# Patient Record
Sex: Male | Born: 1935 | Race: White | Hispanic: No | Marital: Married | Smoking: Never smoker
Health system: Southern US, Community
[De-identification: ages and names within clinical notes are randomized; demographics above are authoritative.]

## PROBLEM LIST (undated history)

## (undated) DIAGNOSIS — F329 Major depressive disorder, single episode, unspecified: Secondary | ICD-10-CM

## (undated) DIAGNOSIS — J45909 Unspecified asthma, uncomplicated: Secondary | ICD-10-CM

## (undated) DIAGNOSIS — R569 Unspecified convulsions: Secondary | ICD-10-CM

## (undated) DIAGNOSIS — F0390 Unspecified dementia without behavioral disturbance: Secondary | ICD-10-CM

## (undated) DIAGNOSIS — F32A Depression, unspecified: Secondary | ICD-10-CM

## (undated) DIAGNOSIS — N183 Chronic kidney disease, stage 3 unspecified: Secondary | ICD-10-CM

## (undated) DIAGNOSIS — I10 Essential (primary) hypertension: Secondary | ICD-10-CM

## (undated) DIAGNOSIS — F419 Anxiety disorder, unspecified: Secondary | ICD-10-CM

## (undated) DIAGNOSIS — E7800 Pure hypercholesterolemia, unspecified: Secondary | ICD-10-CM

## (undated) HISTORY — PX: PROSTATE SURGERY: SHX751

---

## 2005-08-16 ENCOUNTER — Ambulatory Visit: Payer: Self-pay | Admitting: Internal Medicine

## 2005-11-23 ENCOUNTER — Ambulatory Visit: Payer: Self-pay | Admitting: Internal Medicine

## 2005-12-29 ENCOUNTER — Ambulatory Visit: Payer: Self-pay | Admitting: Unknown Physician Specialty

## 2008-06-16 ENCOUNTER — Ambulatory Visit: Payer: Self-pay | Admitting: Internal Medicine

## 2010-01-21 ENCOUNTER — Ambulatory Visit: Payer: Self-pay | Admitting: Internal Medicine

## 2010-02-18 ENCOUNTER — Inpatient Hospital Stay: Payer: Self-pay | Admitting: Internal Medicine

## 2010-02-22 ENCOUNTER — Ambulatory Visit: Payer: Self-pay | Admitting: Unknown Physician Specialty

## 2010-05-17 ENCOUNTER — Ambulatory Visit: Payer: Self-pay | Admitting: Oncology

## 2010-05-27 ENCOUNTER — Ambulatory Visit: Payer: Self-pay | Admitting: Oncology

## 2010-06-17 ENCOUNTER — Ambulatory Visit: Payer: Self-pay | Admitting: Oncology

## 2010-07-17 ENCOUNTER — Ambulatory Visit: Payer: Self-pay | Admitting: Oncology

## 2010-07-29 ENCOUNTER — Ambulatory Visit: Payer: Self-pay | Admitting: Oncology

## 2010-08-17 ENCOUNTER — Ambulatory Visit: Payer: Self-pay | Admitting: Oncology

## 2010-10-28 ENCOUNTER — Ambulatory Visit: Payer: Self-pay | Admitting: Oncology

## 2010-11-17 ENCOUNTER — Ambulatory Visit: Payer: Self-pay | Admitting: Oncology

## 2010-12-13 ENCOUNTER — Ambulatory Visit: Payer: Self-pay | Admitting: Unknown Physician Specialty

## 2011-02-17 ENCOUNTER — Ambulatory Visit: Payer: Self-pay | Admitting: Oncology

## 2011-03-18 ENCOUNTER — Ambulatory Visit: Payer: Self-pay | Admitting: Oncology

## 2015-01-05 ENCOUNTER — Ambulatory Visit: Payer: Self-pay | Admitting: Physician Assistant

## 2015-07-12 ENCOUNTER — Inpatient Hospital Stay
Admission: EM | Admit: 2015-07-12 | Discharge: 2015-07-16 | DRG: 377 | Disposition: A | Discharge status: Skilled Nursing Facility | Payer: Medicare PPO | Attending: Internal Medicine | Admitting: Internal Medicine

## 2015-07-12 ENCOUNTER — Emergency Department: Payer: Medicare PPO

## 2015-07-12 ENCOUNTER — Encounter: Payer: Self-pay | Admitting: *Deleted

## 2015-07-12 DIAGNOSIS — K228 Other specified diseases of esophagus: Secondary | ICD-10-CM | POA: Diagnosis not present

## 2015-07-12 DIAGNOSIS — J45909 Unspecified asthma, uncomplicated: Secondary | ICD-10-CM | POA: Diagnosis present

## 2015-07-12 DIAGNOSIS — Z91041 Radiographic dye allergy status: Secondary | ICD-10-CM | POA: Diagnosis not present

## 2015-07-12 DIAGNOSIS — N179 Acute kidney failure, unspecified: Secondary | ICD-10-CM | POA: Diagnosis present

## 2015-07-12 DIAGNOSIS — F42 Obsessive-compulsive disorder: Secondary | ICD-10-CM | POA: Diagnosis present

## 2015-07-12 DIAGNOSIS — K922 Gastrointestinal hemorrhage, unspecified: Secondary | ICD-10-CM | POA: Diagnosis present

## 2015-07-12 DIAGNOSIS — Z79899 Other long term (current) drug therapy: Secondary | ICD-10-CM | POA: Diagnosis not present

## 2015-07-12 DIAGNOSIS — F32A Depression, unspecified: Secondary | ICD-10-CM

## 2015-07-12 DIAGNOSIS — D649 Anemia, unspecified: Secondary | ICD-10-CM

## 2015-07-12 DIAGNOSIS — E780 Pure hypercholesterolemia: Secondary | ICD-10-CM | POA: Diagnosis present

## 2015-07-12 DIAGNOSIS — K5641 Fecal impaction: Secondary | ICD-10-CM | POA: Diagnosis not present

## 2015-07-12 DIAGNOSIS — K449 Diaphragmatic hernia without obstruction or gangrene: Secondary | ICD-10-CM | POA: Diagnosis present

## 2015-07-12 DIAGNOSIS — F419 Anxiety disorder, unspecified: Secondary | ICD-10-CM | POA: Diagnosis present

## 2015-07-12 DIAGNOSIS — Z801 Family history of malignant neoplasm of trachea, bronchus and lung: Secondary | ICD-10-CM | POA: Diagnosis not present

## 2015-07-12 DIAGNOSIS — D509 Iron deficiency anemia, unspecified: Secondary | ICD-10-CM | POA: Diagnosis not present

## 2015-07-12 DIAGNOSIS — N183 Chronic kidney disease, stage 3 (moderate): Secondary | ICD-10-CM | POA: Diagnosis not present

## 2015-07-12 DIAGNOSIS — F03918 Unspecified dementia, unspecified severity, with other behavioral disturbance: Secondary | ICD-10-CM

## 2015-07-12 DIAGNOSIS — I951 Orthostatic hypotension: Secondary | ICD-10-CM | POA: Diagnosis not present

## 2015-07-12 DIAGNOSIS — E43 Unspecified severe protein-calorie malnutrition: Secondary | ICD-10-CM | POA: Diagnosis present

## 2015-07-12 DIAGNOSIS — Z66 Do not resuscitate: Secondary | ICD-10-CM | POA: Diagnosis not present

## 2015-07-12 DIAGNOSIS — R634 Abnormal weight loss: Secondary | ICD-10-CM

## 2015-07-12 DIAGNOSIS — R296 Repeated falls: Secondary | ICD-10-CM | POA: Diagnosis present

## 2015-07-12 DIAGNOSIS — E860 Dehydration: Secondary | ICD-10-CM | POA: Diagnosis not present

## 2015-07-12 DIAGNOSIS — F329 Major depressive disorder, single episode, unspecified: Secondary | ICD-10-CM | POA: Diagnosis present

## 2015-07-12 DIAGNOSIS — K649 Unspecified hemorrhoids: Secondary | ICD-10-CM | POA: Diagnosis present

## 2015-07-12 DIAGNOSIS — I129 Hypertensive chronic kidney disease with stage 1 through stage 4 chronic kidney disease, or unspecified chronic kidney disease: Secondary | ICD-10-CM | POA: Diagnosis present

## 2015-07-12 DIAGNOSIS — R4182 Altered mental status, unspecified: Secondary | ICD-10-CM

## 2015-07-12 DIAGNOSIS — F0391 Unspecified dementia with behavioral disturbance: Secondary | ICD-10-CM | POA: Diagnosis present

## 2015-07-12 HISTORY — DX: Depression, unspecified: F32.A

## 2015-07-12 HISTORY — DX: Unspecified asthma, uncomplicated: J45.909

## 2015-07-12 HISTORY — DX: Unspecified dementia, unspecified severity, without behavioral disturbance, psychotic disturbance, mood disturbance, and anxiety: F03.90

## 2015-07-12 HISTORY — DX: Chronic kidney disease, stage 3 (moderate): N18.3

## 2015-07-12 HISTORY — DX: Essential (primary) hypertension: I10

## 2015-07-12 HISTORY — DX: Chronic kidney disease, stage 3 unspecified: N18.30

## 2015-07-12 HISTORY — DX: Anxiety disorder, unspecified: F41.9

## 2015-07-12 HISTORY — DX: Pure hypercholesterolemia, unspecified: E78.00

## 2015-07-12 HISTORY — DX: Major depressive disorder, single episode, unspecified: F32.9

## 2015-07-12 LAB — CBC
HCT: 23.2 % — ABNORMAL LOW (ref 40.0–52.0)
Hemoglobin: 7.4 g/dL — ABNORMAL LOW (ref 13.0–18.0)
MCH: 26 pg (ref 26.0–34.0)
MCHC: 31.8 g/dL — ABNORMAL LOW (ref 32.0–36.0)
MCV: 81.8 fL (ref 80.0–100.0)
PLATELETS: 383 10*3/uL (ref 150–440)
RBC: 2.84 MIL/uL — AB (ref 4.40–5.90)
RDW: 17 % — AB (ref 11.5–14.5)
WBC: 6.5 10*3/uL (ref 3.8–10.6)

## 2015-07-12 LAB — IRON AND TIBC
Iron: 14 ug/dL — ABNORMAL LOW (ref 45–182)
SATURATION RATIOS: 4 % — AB (ref 17.9–39.5)
TIBC: 376 ug/dL (ref 250–450)
UIBC: 362 ug/dL

## 2015-07-12 LAB — AMMONIA: Ammonia: 16 umol/L (ref 9–35)

## 2015-07-12 LAB — RETICULOCYTES
RBC.: 2.9 MIL/uL — AB (ref 4.40–5.90)
RETIC CT PCT: 1.3 % (ref 0.4–3.1)
Retic Count, Absolute: 37.7 10*3/uL (ref 19.0–183.0)

## 2015-07-12 LAB — URINALYSIS COMPLETE WITH MICROSCOPIC (ARMC ONLY)
BILIRUBIN URINE: NEGATIVE
Bacteria, UA: NONE SEEN
Glucose, UA: NEGATIVE mg/dL
HGB URINE DIPSTICK: NEGATIVE
LEUKOCYTES UA: NEGATIVE
NITRITE: NEGATIVE
PH: 6 (ref 5.0–8.0)
PROTEIN: NEGATIVE mg/dL
RBC / HPF: NONE SEEN RBC/hpf (ref 0–5)
SPECIFIC GRAVITY, URINE: 1.019 (ref 1.005–1.030)
Squamous Epithelial / LPF: NONE SEEN

## 2015-07-12 LAB — COMPREHENSIVE METABOLIC PANEL
ALT: 16 U/L — AB (ref 17–63)
AST: 22 U/L (ref 15–41)
Albumin: 3.7 g/dL (ref 3.5–5.0)
Alkaline Phosphatase: 48 U/L (ref 38–126)
Anion gap: 6 (ref 5–15)
BUN: 24 mg/dL — ABNORMAL HIGH (ref 6–20)
CHLORIDE: 105 mmol/L (ref 101–111)
CO2: 27 mmol/L (ref 22–32)
CREATININE: 1.66 mg/dL — AB (ref 0.61–1.24)
Calcium: 9.7 mg/dL (ref 8.9–10.3)
GFR calc non Af Amer: 37 mL/min — ABNORMAL LOW (ref >60)
GFR, EST AFRICAN AMERICAN: 43 mL/min — AB (ref >60)
Glucose, Bld: 76 mg/dL (ref 65–99)
POTASSIUM: 3.8 mmol/L (ref 3.5–5.1)
SODIUM: 138 mmol/L (ref 135–145)
Total Bilirubin: 0.7 mg/dL (ref 0.3–1.2)
Total Protein: 6.6 g/dL (ref 6.5–8.1)

## 2015-07-12 LAB — VITAMIN B12: Vitamin B-12: 774 pg/mL (ref 180–914)

## 2015-07-12 LAB — FERRITIN: FERRITIN: 5 ng/mL — AB (ref 24–336)

## 2015-07-12 LAB — TROPONIN I
Troponin I: <0.03 ng/mL (ref <0.031)
Troponin I: <0.03 ng/mL (ref <0.031)

## 2015-07-12 LAB — FOLATE: Folate: 12 ng/mL (ref >5.9)

## 2015-07-12 MED ORDER — ACETAMINOPHEN 650 MG RE SUPP: 650.0000 mg | Freq: Four times a day (QID) | RECTAL | Status: DC | PRN

## 2015-07-12 MED ORDER — SODIUM CHLORIDE 0.9 % IV SOLN
INTRAVENOUS | Status: DC
  Administered 2015-07-12 – 2015-07-13 (×2): via INTRAVENOUS

## 2015-07-12 MED ORDER — SODIUM CHLORIDE 0.9 % IV BOLUS (SEPSIS)
500.0000 mL | Freq: Once | INTRAVENOUS | Status: AC
  Administered 2015-07-12: 500 mL via INTRAVENOUS

## 2015-07-12 MED ORDER — ALBUTEROL SULFATE (2.5 MG/3ML) 0.083% IN NEBU: 2.5000 mg | INHALATION_SOLUTION | RESPIRATORY_TRACT | Status: DC | PRN

## 2015-07-12 MED ORDER — ONDANSETRON HCL 4 MG PO TABS: 4.0000 mg | ORAL_TABLET | Freq: Four times a day (QID) | ORAL | Status: DC | PRN

## 2015-07-12 MED ORDER — ONDANSETRON HCL 4 MG/2ML IJ SOLN: 4.0000 mg | Freq: Four times a day (QID) | INTRAMUSCULAR | Status: DC | PRN

## 2015-07-12 MED ORDER — MIRTAZAPINE 15 MG PO TABS
15.0000 mg | ORAL_TABLET | Freq: Every day | ORAL | Status: DC
  Administered 2015-07-12 – 2015-07-15 (×4): 15 mg via ORAL
  Filled 2015-07-12 (×4): qty 1

## 2015-07-12 MED ORDER — ACETAMINOPHEN 325 MG PO TABS: 650.0000 mg | ORAL_TABLET | Freq: Four times a day (QID) | ORAL | Status: DC | PRN

## 2015-07-12 MED ORDER — SENNOSIDES-DOCUSATE SODIUM 8.6-50 MG PO TABS: 1.0000 | ORAL_TABLET | Freq: Every evening | ORAL | Status: DC | PRN

## 2015-07-12 MED ORDER — BUPROPION HCL ER (XL) 150 MG PO TB24
450.0000 mg | ORAL_TABLET | Freq: Every day | ORAL | Status: DC
  Administered 2015-07-12 – 2015-07-16 (×5): 450 mg via ORAL
  Filled 2015-07-12 (×5): qty 3

## 2015-07-12 MED ORDER — VENLAFAXINE HCL ER 75 MG PO CP24
150.0000 mg | ORAL_CAPSULE | Freq: Every morning | ORAL | Status: DC
  Administered 2015-07-12 – 2015-07-16 (×5): 150 mg via ORAL
  Filled 2015-07-12 (×5): qty 2

## 2015-07-12 MED ORDER — SODIUM CHLORIDE 0.9 % IJ SOLN
3.0000 mL | Freq: Two times a day (BID) | INTRAMUSCULAR | Status: DC
  Administered 2015-07-14 – 2015-07-16 (×5): 3 mL via INTRAVENOUS

## 2015-07-12 NOTE — H&P (Signed)
[facility] - [place] [facility]   PATIENT NAME: Benjamin Kim    MR#:  [id number]  DATE OF BIRTH:  August 21, 1932  DATE OF ADMISSION:  07/12/2015  PRIMARY CARE PHYSICIAN: Lauro Regulus., MD   REQUESTING/REFERRING PHYSICIAN: Dr. Scotty Court  CHIEF COMPLAINT:   Chief Complaint  Patient presents with  . Weakness    Per EMS pt has fallen x 3 since yesterday. States family reported that pt has had increased weakness and slurred speech.     HISTORY OF PRESENT ILLNESS:  Benjamin Kim  is a 81 y.o. male with a known history of dementia, depression, hypertension, CKD-3, hemorrhoids presents after fall. History is provided by his wife as the patient is a poor historian. She states that he has had multiple falls since March and has had increasing confusion over the past few weeks. She states that last night she went out to walk the dog and when she came home the patient had fallen and was laying on the floor in the hallway. He was combative and confused and it was difficult to get him up to the bed. At 1:30 AM on the day of admission he got up to use the bathroom she attempted to assist him but he fell again. He was again combative and confused and would not accept her assistance. She called 911. On emergency room evaluation he has no significant traumatic injury from the fall but has been found to be anemic with a hemoglobin of 7.4. The wife denies any recent abdominal pain nausea or vomiting. She states that he has  hemorrhoids and often sees blood with bowel movements. He is frequently constipated and straining. He has had a colonoscopy in the past few years his primary gastroenterologist is Dr. Mechele Collin. His appetite has been severely decreased over the past few months due to anxiety.  PAST MEDICAL HISTORY:   Past Medical History  Diagnosis Date  . Hypertension   . Hypercholesteremia   . Depressed   . CKD (chronic kidney disease) stage 3, GFR 30-59 ml/min   .  Asthma   . Dementia   . Anxiety     PAST SURGICAL HISTORY:   Past Surgical History  Procedure Laterality Date  . Prostate surgery      SOCIAL HISTORY:   Social History  Substance Use Topics  . Smoking status: Never Smoker   . Smokeless tobacco: Not on file  . Alcohol Use: No    FAMILY HISTORY:   Family History  Problem Relation Age of Onset  . Lung cancer Father     DRUG ALLERGIES:   Allergies  Allergen Reactions  . Ivp Dye [Iodinated Diagnostic Agents] Anaphylaxis    REVIEW OF SYSTEMS:   ROS  Unable to obtain due to patient's altered mental status, dementia and confusion  MEDICATIONS AT HOME:   Prior to Admission medications   Medication Sig Start Date End Date Taking? Authorizing Provider  buPROPion (WELLBUTRIN XL) 150 MG 24 hr tablet Take 3 tablets by mouth daily. 07/07/15  Yes Historical Provider, MD  LORazepam (ATIVAN) 0.5 MG tablet Take 0.5 mg by mouth 2 (two) times daily. Takes at 1pm and bedtime 07/07/15  Yes Historical Provider, MD  mirtazapine (REMERON) 15 MG tablet Take 1 tablet by mouth at bedtime. 06/12/15  Yes Historical Provider, MD  temazepam (RESTORIL) 7.5 MG capsule Take 7.5 mg by mouth at bedtime. 07/07/15  Yes Historical Provider, MD  venlafaxine XR (EFFEXOR-XR) 75 MG 24 hr capsule Take 150 mg by  mouth every morning. 06/16/15  Yes Historical Provider, MD      VITAL SIGNS:  Blood pressure 130/69, pulse 74, temperature 98.2 F (36.8 C), temperature source Oral, resp. rate 14, weight 59.506 kg (131 lb 3 oz), SpO2 97 %.  PHYSICAL EXAMINATION:  GENERAL:  81 y.o.-year-old patient lying in the bed with no acute distress. Pale, thin EYES: Pupils equal, round, reactive to light and accommodation. No scleral icterus. Extraocular muscles intact.  HEENT: Head atraumatic, normocephalic. Oropharynx and nasopharynx clear. Mucous membranes are dry NECK:  Supple, no jugular venous distention. No thyroid enlargement, no tenderness.  LUNGS: Normal breath  sounds bilaterally, no wheezing, rales,rhonchi or crepitation. No use of accessory muscles of respiration.  CARDIOVASCULAR: S1, S2 normal. No murmurs, rubs, or gallops.  ABDOMEN: Soft, nontender, nondistended. Bowel sounds present. No organomegaly or mass. No guarding no rebound EXTREMITIES: No pedal edema, cyanosis, or clubbing.  NEUROLOGIC: Patient very sleepy, but refuses to open his eyes or participate in examination, cranial nerves grossly intact and he does move all 4 extremities spontaneously  PSYCHIATRIC: The patient is sleepy, oriented to person and place SKIN: No obvious rash, lesion, or ulcer.   LABORATORY PANEL:   CBC  Recent Labs Lab 07/12/15 0328  WBC 6.5  HGB 7.4*  HCT 23.2*  PLT 383   ------------------------------------------------------------------------------------------------------------------  Chemistries   Recent Labs Lab 07/12/15 0328  NA 138  K 3.8  CL 105  CO2 27  GLUCOSE 76  BUN 24*  CREATININE 1.66*  CALCIUM 9.7  AST 22  ALT 16*  ALKPHOS 48  BILITOT 0.7   ------------------------------------------------------------------------------------------------------------------  Cardiac Enzymes  Recent Labs Lab 07/12/15 0851  TROPONINI <0.03   ------------------------------------------------------------------------------------------------------------------  RADIOLOGY:  Ct Head Wo Contrast  07/12/2015   CLINICAL DATA:  Multiple falls since yesterday. Increased weakness and slurred speech. Altered mental status.  EXAM: CT HEAD WITHOUT CONTRAST  CT CERVICAL SPINE WITHOUT CONTRAST  TECHNIQUE: Multidetector CT imaging of the head and cervical spine was performed following the standard protocol without intravenous contrast. Multiplanar CT image reconstructions of the cervical spine were also generated.  COMPARISON:  CT head 01/05/2015  FINDINGS: CT HEAD FINDINGS  Diffuse cerebral atrophy. Ventricular dilatation consistent with central atrophy.  Low-attenuation changes throughout the deep white matter consistent with small vessel ischemia. Old lacune in the right basal ganglia. No mass effect or midline shift. No abnormal extra-axial fluid collections. Gray-white matter junctions are distinct. Basal cisterns are not effaced. No evidence of acute intracranial hemorrhage. No depressed skull fractures. Visualized paranasal sinuses and mastoid air cells are not opacified. Subcutaneous scalp lesion over the right posterior parietal lesion appears to represent a lipoma. No change since prior study.  CT CERVICAL SPINE FINDINGS  Slight anterior subluxation of C7 on T1 is likely degenerative but ligamentous injury not excluded. Otherwise normal alignment of the cervical spine. Diffuse multilevel degenerative changes with narrowed disc spaces and associated endplate hypertrophic changes. Coalition of C5-C6 and near coalition of C4 days C5 is likely degenerative. Degenerative changes throughout the cervical facet joints.  IMPRESSION: No acute intracranial abnormalities. Chronic atrophy and small vessel ischemic changes.  Diffuse degenerative change throughout the cervical spine. Slight anterior subluxation of C7 on T1 is probably degenerative but ligamentous injury not entirely excluded. No acute displaced fractures identified.   Electronically Signed   By: Burman Nieves M.D.   On: 07/12/2015 04:27   Ct Cervical Spine Wo Contrast  07/12/2015   CLINICAL DATA:  Multiple falls since yesterday. Increased weakness and  slurred speech. Altered mental status.  EXAM: CT HEAD WITHOUT CONTRAST  CT CERVICAL SPINE WITHOUT CONTRAST  TECHNIQUE: Multidetector CT imaging of the head and cervical spine was performed following the standard protocol without intravenous contrast. Multiplanar CT image reconstructions of the cervical spine were also generated.  COMPARISON:  CT head 01/05/2015  FINDINGS: CT HEAD FINDINGS  Diffuse cerebral atrophy. Ventricular dilatation consistent with  central atrophy. Low-attenuation changes throughout the deep white matter consistent with small vessel ischemia. Old lacune in the right basal ganglia. No mass effect or midline shift. No abnormal extra-axial fluid collections. Gray-white matter junctions are distinct. Basal cisterns are not effaced. No evidence of acute intracranial hemorrhage. No depressed skull fractures. Visualized paranasal sinuses and mastoid air cells are not opacified. Subcutaneous scalp lesion over the right posterior parietal lesion appears to represent a lipoma. No change since prior study.  CT CERVICAL SPINE FINDINGS  Slight anterior subluxation of C7 on T1 is likely degenerative but ligamentous injury not excluded. Otherwise normal alignment of the cervical spine. Diffuse multilevel degenerative changes with narrowed disc spaces and associated endplate hypertrophic changes. Coalition of C5-C6 and near coalition of C4 days C5 is likely degenerative. Degenerative changes throughout the cervical facet joints.  IMPRESSION: No acute intracranial abnormalities. Chronic atrophy and small vessel ischemic changes.  Diffuse degenerative change throughout the cervical spine. Slight anterior subluxation of C7 on T1 is probably degenerative but ligamentous injury not entirely excluded. No acute displaced fractures identified.   Electronically Signed   By: Burman Nieves M.D.   On: 07/12/2015 04:27   Dg Chest Portable 1 View  07/12/2015   CLINICAL DATA:  Fall, weakness, slurred speech  EXAM: PORTABLE CHEST 1 VIEW  COMPARISON:  None.  FINDINGS: Mild elevation of the left hemidiaphragm. Associated left basilar opacity, likely atelectasis, less likely pneumonia. No pleural effusion or pneumothorax.  The heart is normal in size.  IMPRESSION: Left basilar opacity, favored to reflect atelectasis, pneumonia not excluded.   Electronically Signed   By: Charline Bills M.D.   On: 07/12/2015 08:24    EKG:   Orders placed or performed during the  hospital encounter of 07/12/15  . ED EKG  . ED EKG  . EKG 12-Lead  . EKG 12-Lead    IMPRESSION AND PLAN:   #1 anemia: Hemoglobin 7.4, stool is heme positive. Last hemoglobin March 2016 is 12.5 . I have ordered an anemia panel. We'll continue to monitor CBC every 8 hours. Blood pressure is currently stable. Will consult gastroenterology.  #2 confusion, dementia, OCD, anxiety and depression: His wife reports that over the past year he has had a decline in mental status. He has had multiple medication changes by primary care and psychiatry. He does not have a urinary tract infection. Chest x-ray with possible pneumonia, but with no fevers and leukocytosis or cough this is not very likely. Check B12 and TSH. This is very likely progressive dementia. We'll get a physical therapy consultation and also social work. Currently the wife seems overwhelmed taking care of him at home.  #3 hypertension: Blood pressure initially fairly low, will hold on antihypertensives for now. Continue IV hydration.  #4 fall: Patient fell after getting up from laying down. Very likely orthostatic with decreased by mouth intake likely volume depletion. Will provide IV fluids. Physical therapy consultation. No significant injury from the fall.  #5 acute on chronic kidney disease stage III: His baseline creatinine is 1.2, so slightly increased today. Continue with IV fluids.  CODE STATUS:  DO NOT RESUSCITATE. This was discussed with the patient's wife on admission.  TOTAL TIME TAKING CARE OF THIS PATIENT: 45 minutes.  Greater than 50% of time spent in coordination of care and counseling.  Elby Showers M.D on 07/12/2015 at 11:32 AM  Between 7am to 6pm - Pager - [phone]  After 6pm go to [email or web address] - password [email or web address]  [facility]  Office  [phone]  CC: Primary care physician; Lauro Regulus., MD

## 2015-07-12 NOTE — ED Notes (Signed)
Per EMS pt has fallen x 3 since yesterday. States family reported that pt has had increased weakness and slurred speech.

## 2015-07-12 NOTE — ED Notes (Signed)
Lab notified to add blood work on to previous blood drawn.

## 2015-07-12 NOTE — Consult Note (Signed)
GI Inpatient Consult Note Christena Deem MD  Reason for Consult: Anemia and Hemoccult-positive stool   Attending Requesting Consult: Dr. Elby Showers  Outpatient Primary Physician: Dr. Einar Crow  History of Present Illness: History is obtained from patient's wife and son who are present in the room. Benjamin Kim is a 81 y.o. male with a history of hypertension, dementia, chronic kidney disease, who was brought to the emergency room after several falls on evaluation he was found to have anemia and a Hemoccult-positive stool. He does have a personal history of hemorrhoids and will at times see blood with bowel movements apparently. He had been taking a twice a day proton pump inhibitor for several months however that was discontinued apparently. Is no abdominal pain. He does have issues with constipation. His wife it started him on some probiotics which seemed to help with the nausea. He has had a lot of decline in his mental status over the past couple of months in regards to dementia. On evaluation in the emergency room his ferritin was 5 iron was 14 and his hemoglobin was 7.4.  Patient has a history of previous evaluation for iron deficiency anemia the last time in 2011. He had an EGD and colonoscopy on 02/22/2010 with a finding of diverticulosis and internal hemorrhoids. The EGD showed a large hiatal hernia with a possible ulceration. He has not had an upper GI series.  Past Medical History:  Past Medical History  Diagnosis Date  . Hypertension   . Hypercholesteremia   . Depressed   . CKD (chronic kidney disease) stage 3, GFR 30-59 ml/min   . Asthma   . Dementia   . Anxiety     Problem List: Patient Active Problem List   Diagnosis Date Noted  . Anemia 07/12/2015  . Gastrointestinal bleeding 07/12/2015    Past Surgical History: Past Surgical History  Procedure Laterality Date  . Prostate surgery      Allergies: Allergies  Allergen Reactions  . Ivp Dye  [Iodinated Diagnostic Agents] Anaphylaxis    Home Medications: Prescriptions prior to admission  Medication Sig Dispense Refill Last Dose  . buPROPion (WELLBUTRIN XL) 150 MG 24 hr tablet Take 3 tablets by mouth daily.  4 07/11/2015 at Unknown time  . LORazepam (ATIVAN) 0.5 MG tablet Take 0.5 mg by mouth 2 (two) times daily. Takes at 1pm and bedtime  2 07/11/2015 at Unknown time  . mirtazapine (REMERON) 15 MG tablet Take 1 tablet by mouth at bedtime.  11 07/11/2015 at Unknown time  . temazepam (RESTORIL) 7.5 MG capsule Take 7.5 mg by mouth at bedtime.  3 07/11/2015 at Unknown time  . venlafaxine XR (EFFEXOR-XR) 75 MG 24 hr capsule Take 150 mg by mouth every morning.  4 07/11/2015 at Unknown time   Home medication reconciliation was completed with the patient.   Scheduled Inpatient Medications:   . buPROPion  450 mg Oral Daily  . mirtazapine  15 mg Oral QHS  . sodium chloride  3 mL Intravenous Q12H  . venlafaxine XR  150 mg Oral q morning - 10a    Continuous Inpatient Infusions:   . sodium chloride 75 mL/hr at 07/12/15 1502    PRN Inpatient Medications:  acetaminophen **OR** acetaminophen, albuterol, ondansetron **OR** ondansetron (ZOFRAN) IV, senna-docusate  Family History: family history includes Lung cancer in his father.   GI Family History: Negative for colorectal cancer liver disease or ulcers  Social History:   reports that he has never smoked. He does not  have any smokeless tobacco history on file. He reports that he does not drink alcohol. The patient denies ETOH, tobacco, or drug use.   ROS  Review of Systems: 10 systems reviewed per admission history and physical agree with same  Physical Examination: BP 133/64 mmHg  Pulse 95  Temp(Src) 98.3 F (36.8 C) (Oral)  Resp 18  Ht  (1.676 m)  Wt 59.875 kg (132 lb)  BMI 21.32 kg/m2  SpO2 99% Gen: Elderly appearing 81 year old male no acute distress HEENT: Normocephalic atraumatic eyes are anicteric Neck: No JVD no  thyromegaly no lymphadenopathy Chest: Clear to auscultation CV: Regular rate and rhythm Abd: Soft nontender nondistended bowel sounds positive normoactive Ext: No clubbing cyanosis or edema Skin: Other:  Data: Lab Results  Component Value Date   WBC 6.5 07/12/2015   HGB 7.4* 07/12/2015   HCT 23.2* 07/12/2015   MCV 81.8 07/12/2015   PLT 383 07/12/2015    Recent Labs Lab 07/12/15 0328  HGB 7.4*   Lab Results  Component Value Date   NA 138 07/12/2015   K 3.8 07/12/2015   CL 105 07/12/2015   CO2 27 07/12/2015   BUN 24* 07/12/2015   CREATININE 1.66* 07/12/2015   Lab Results  Component Value Date   ALT 16* 07/12/2015   AST 22 07/12/2015   ALKPHOS 48 07/12/2015   BILITOT 0.7 07/12/2015   No results for input(s): APTT, INR, PTT in the last 168 hours. CBC Latest Ref Rng 07/12/2015  WBC 3.8 - 10.6 K/uL 6.5  Hemoglobin 13.0 - 18.0 g/dL 7.4(L)  Hematocrit 40.0 - 52.0 % 23.2(L)  Platelets 150 - 440 K/uL 383    STUDIES: Ct Head Wo Contrast  07/12/2015   CLINICAL DATA:  Multiple falls since yesterday. Increased weakness and slurred speech. Altered mental status.  EXAM: CT HEAD WITHOUT CONTRAST  CT CERVICAL SPINE WITHOUT CONTRAST  TECHNIQUE: Multidetector CT imaging of the head and cervical spine was performed following the standard protocol without intravenous contrast. Multiplanar CT image reconstructions of the cervical spine were also generated.  COMPARISON:  CT head 01/05/2015  FINDINGS: CT HEAD FINDINGS  Diffuse cerebral atrophy. Ventricular dilatation consistent with central atrophy. Low-attenuation changes throughout the deep white matter consistent with small vessel ischemia. Old lacune in the right basal ganglia. No mass effect or midline shift. No abnormal extra-axial fluid collections. Gray-white matter junctions are distinct. Basal cisterns are not effaced. No evidence of acute intracranial hemorrhage. No depressed skull fractures. Visualized paranasal sinuses and mastoid  air cells are not opacified. Subcutaneous scalp lesion over the right posterior parietal lesion appears to represent a lipoma. No change since prior study.  CT CERVICAL SPINE FINDINGS  Slight anterior subluxation of C7 on T1 is likely degenerative but ligamentous injury not excluded. Otherwise normal alignment of the cervical spine. Diffuse multilevel degenerative changes with narrowed disc spaces and associated endplate hypertrophic changes. Coalition of C5-C6 and near coalition of C4 days C5 is likely degenerative. Degenerative changes throughout the cervical facet joints.  IMPRESSION: No acute intracranial abnormalities. Chronic atrophy and small vessel ischemic changes.  Diffuse degenerative change throughout the cervical spine. Slight anterior subluxation of C7 on T1 is probably degenerative but ligamentous injury not entirely excluded. No acute displaced fractures identified.   Electronically Signed   By: Burman Nieves M.D.   On: 07/12/2015 04:27   Ct Cervical Spine Wo Contrast  07/12/2015   CLINICAL DATA:  Multiple falls since yesterday. Increased weakness and slurred speech. Altered mental status.  EXAM: CT HEAD WITHOUT CONTRAST  CT CERVICAL SPINE WITHOUT CONTRAST  TECHNIQUE: Multidetector CT imaging of the head and cervical spine was performed following the standard protocol without intravenous contrast. Multiplanar CT image reconstructions of the cervical spine were also generated.  COMPARISON:  CT head 01/05/2015  FINDINGS: CT HEAD FINDINGS  Diffuse cerebral atrophy. Ventricular dilatation consistent with central atrophy. Low-attenuation changes throughout the deep white matter consistent with small vessel ischemia. Old lacune in the right basal ganglia. No mass effect or midline shift. No abnormal extra-axial fluid collections. Gray-white matter junctions are distinct. Basal cisterns are not effaced. No evidence of acute intracranial hemorrhage. No depressed skull fractures. Visualized paranasal  sinuses and mastoid air cells are not opacified. Subcutaneous scalp lesion over the right posterior parietal lesion appears to represent a lipoma. No change since prior study.  CT CERVICAL SPINE FINDINGS  Slight anterior subluxation of C7 on T1 is likely degenerative but ligamentous injury not excluded. Otherwise normal alignment of the cervical spine. Diffuse multilevel degenerative changes with narrowed disc spaces and associated endplate hypertrophic changes. Coalition of C5-C6 and near coalition of C4 days C5 is likely degenerative. Degenerative changes throughout the cervical facet joints.  IMPRESSION: No acute intracranial abnormalities. Chronic atrophy and small vessel ischemic changes.  Diffuse degenerative change throughout the cervical spine. Slight anterior subluxation of C7 on T1 is probably degenerative but ligamentous injury not entirely excluded. No acute displaced fractures identified.   Electronically Signed   By: Burman Nieves M.D.   On: 07/12/2015 04:27   Dg Chest Portable 1 View  07/12/2015   CLINICAL DATA:  Fall, weakness, slurred speech  EXAM: PORTABLE CHEST 1 VIEW  COMPARISON:  None.  FINDINGS: Mild elevation of the left hemidiaphragm. Associated left basilar opacity, likely atelectasis, less likely pneumonia. No pleural effusion or pneumothorax.  The heart is normal in size.  IMPRESSION: Left basilar opacity, favored to reflect atelectasis, pneumonia not excluded.   Electronically Signed   By: Charline Bills M.D.   On: 07/12/2015 08:24   @  Assessment: 1. Iron deficiency anemia with Hemoccult-positive stool. Previous GI evaluation has indicated a large hiatal hernia this raising the possibility of Cameron erosions with slow blood loss. His proton pump inhibitor was recently stopped.  Recommendations: 1. She is family is hesitant to allow sedated procedure. As such we will arrange to have a upper GI series done to better define the hiatal hernia. If there are other  lesions acquiring further management EGD may be needed but that will have to be discussed further with family members again. I would place him back on a daily proton pump inhibitor and add Carafate 1 g 4 times a day. Will await results of upper GI series for further recommendations.  Thank you for the consult. Please call with questions or concerns.  Christena Deem, MD  07/12/2015 11:53 PM

## 2015-07-12 NOTE — ED Provider Notes (Signed)
-----------------------------------------   9:21 AM on 07/12/2015 -----------------------------------------  Vital signs remained stable. Review of records shows that the hemoglobin has dropped from 12.5-7.4 since March. Hemoccult positive per Dr. Leone Payor report, consistent with GI bleed and symptomatic anemia. Chest x-ray and urinalysis do not reveal any additional concerns at this time.  EKG interpreted by me  Date: 07/12/2015  Rate: 80  Rhythm: normal sinus rhythm  QRS Axis: normal  Intervals: normal  ST/T Wave abnormalities: normal  Conduction Disutrbances: none  Narrative Interpretation: unremarkable   Care of the patient was discussed with the hospitalist Dr. Allena Katz  for admission at 9:20 AM.  Final diagnoses:  Altered mental status, unspecified altered mental status type  Gastrointestinal hemorrhage, unspecified gastritis, unspecified gastrointestinal hemorrhage type  Symptomatic anemia      Sharman Cheek, MD 07/12/15 [phone]

## 2015-07-12 NOTE — ED Notes (Signed)
MD at bedside. 

## 2015-07-12 NOTE — Progress Notes (Signed)
Physical Therapy Evaluation Patient Details Name: Benjamin Kim MRN: [id number] DOB: 23-May-1932 Today's Date: 07/12/2015   History of Present Illness  Patient is an 81 y.o. male who was seen in ED on 25 September after sustaining multiple falls and experiencing progressive weakness over past 3 weeks.  Clinical Impression  Patient intermittently sleeping upon arrival of PT to ED with wife at bedside. Subjective history obtained from patient's wife. Patient confused and disoriented, able to state name after asking in various ways. Patient's wife admits that his cognition has worsened and that he has had some changes in medications in recent past. Patient able to follow one step commands inconsistently with verbal/tactile/visual cues to assess muscle strength/bed mobility. Due to confusion, it was deemed unsafe to assess transfers/gait at this point in time. Patient's wife detailed history of falls increasing in frequency more recently and notes that he tends to shuffle feet upon standing. Because patient lacks awareness of deficits/safety, patient will continue to benefit from progressive PT to improve function. At this point in time, patient is not safe to return home and would benefit from going to a SNF upon discharge.    Follow Up Recommendations SNF    Equipment Recommendations  Rolling walker with 5" wheels    Recommendations for Other Services OT consult     Precautions / Restrictions Precautions Precautions: Fall Restrictions Weight Bearing Restrictions: No      Mobility  Bed Mobility Overal bed mobility: Needs Assistance Bed Mobility: Rolling Rolling: Max assist         General bed mobility comments: Patient required maximal assistance with verbal/tactile cues to roll from R side to supine in bed. Demonstrates inability to follow commands.   Transfers Overall transfer level:  (Not assessed)                  Ambulation/Gait Ambulation/Gait assistance:  (Not  assessed)              Stairs            Wheelchair Mobility    Modified Rankin (Stroke Patients Only)       Balance Overall balance assessment: History of Falls                                           Pertinent Vitals/Pain Pain Assessment: No/denies pain    Home Living Family/patient expects to be discharged to:: Private residence Living Arrangements: Spouse/significant other Available Help at Discharge: Family;Available PRN/intermittently Type of Home: House Home Access: Level entry     Home Layout: One level Home Equipment: Walker - 4 wheels;Grab bars - tub/shower Additional Comments: Patient has become progressively more dependent on wife for ADLs, including toileting/bathing/dressing. Patient limited to household ambulation with assistance and rollator.    Prior Function Level of Independence: Needs assistance   Gait / Transfers Assistance Needed: Patient requires assistance from wife, who admits that patient has become more irritated with her assistance recently.  ADL's / Homemaking Assistance Needed: Patient is dependent on wife for ADLs, such as toileting/bathing/dressing and for obtaining groceries/cooking.  Comments: Patient's wife states that patient has been eating less and she has noticed worsening weakness/dependence.     Hand Dominance        Extremity/Trunk Assessment   Upper Extremity Assessment: Difficult to assess due to impaired cognition;Generalized weakness  Lower Extremity Assessment: Generalized weakness;Difficult to assess due to impaired cognition      Cervical / Trunk Assessment: Kyphotic  Communication   Communication: Other (comment) (Patient muttering, not answering questions directly)  Cognition Arousal/Alertness: Lethargic Behavior During Therapy: Restless (Unable to follow commands verbally/visually) Overall Cognitive Status: Impaired/Different from baseline Area of Impairment:  Orientation;Attention;Following commands;Safety/judgement;Awareness;Problem solving;Memory Orientation Level: Person (Required various forms of questioning to say his name) Current Attention Level: Alternating   Following Commands: Follows one step commands inconsistently Safety/Judgement: Decreased awareness of deficits;Decreased awareness of safety Awareness:  (Not aware of current situation)   General Comments: Patient's cognition appears to be different from baseline but has progressively changed over past 3 weeks according to patient's wife. Disoriented to place, time, and situation. Patient unable to follow one step commands consistently but can occasionally follow instructions with 25% follow through with visual cues.    General Comments      Exercises        Assessment/Plan    PT Assessment Patient needs continued PT services  PT Diagnosis Altered mental status;Generalized weakness;Difficulty walking   PT Problem List Decreased strength;Decreased safety awareness;Decreased cognition;Decreased mobility;Decreased balance;Decreased activity tolerance  PT Treatment Interventions DME instruction;Gait training;Functional mobility training;Therapeutic activities;Therapeutic exercise;Balance training;Patient/family education;Cognitive remediation   PT Goals (Current goals can be found in the Care Plan section) Acute Rehab PT Goals Patient Stated Goal: Unable to state PT Goal Formulation: With patient/family Time For Goal Achievement: 07/26/15 Potential to Achieve Goals: Fair    Frequency Min 2X/week   Barriers to discharge Inaccessible home environment;Decreased caregiver support      Co-evaluation               End of Session   Activity Tolerance: Patient limited by lethargy;Other (comment) (Confusion) Patient left: in bed;with call bell/phone within reach;with family/visitor present Nurse Communication: Mobility status;Other (comment) (Inability to assess mobility  d/t confusion/lack of awareness)         Time: 1030-1100 PT Time Calculation (min) (ACUTE ONLY): 30 min   Charges:   PT Evaluation $Initial PT Evaluation Tier I: 1 Procedure     PT G Codes:        Neita Carp, PT, DPT 07/12/2015, 11:29 AM

## 2015-07-12 NOTE — Clinical Social Work Note (Signed)
Clinical Social Work Assessment  Patient Details  Name: Benjamin Kim MRN: [id number] Date of Birth: [birth year]  Date of referral:  07/12/15               Reason for consult:  Facility Placement                Permission sought to share information with:  Facility Medical sales representative, Family Supports Permission granted to share information::  Yes, Verbal Permission Granted  Name::     Benjamin Kim [phone]  Agency::  SNF in [place]   Relationship::     Contact Information:     Housing/Transportation Living arrangements for the past 2 months:  Single Family Home Source of Information:  Patient, Spouse, Adult Children Patient Interpreter Needed:  None Criminal Activity/Legal Involvement Pertinent to Current Situation/Hospitalization:  No - Comment as needed Significant Relationships:  Adult Children, Spouse Lives with:  Spouse Do you feel safe going back to the place where you live?  No Need for family participation in patient care:  Yes (Comment)  Care giving concerns:  Current concerns are being able to get into [facility] for SNF placement.  CSW advised that she would reach out to facility but was not able to guarantee placement   Social Worker assessment / plan:  CSW explained what was recommended by PT, and what SNF placement was.  CSW also explained how medicare coverage worked for SNF placement.  Families current placement preference is [facility]	[place], but they are not farmiliar with any other facility.  CSW will send out bed offers and f/u once offers have been received.  Employment status:  Disabled (Comment on whether or not currently receiving Disability), Retired Health and safety inspector:  Medicare PT Recommendations:  Skilled Nursing Facility Information / Referral to community resources:  Skilled Nursing Facility  Patient/Family's Response to care:  Pt and pt's Benjamin are in agreement with SNF placement.    Patient/Family's Understanding of and Emotional  Response to Diagnosis, Current Treatment, and Prognosis:  Family verbalized their understanding for SNF placement and were in agreement.    Emotional Assessment Appearance:  Appears stated age Attitude/Demeanor/Rapport:   (polite but confused ) Affect (typically observed):  Appropriate Orientation:  Oriented to Self, Oriented to Place Alcohol / Substance use:  Never Used Psych involvement (Current and /or in the community):  No (Comment)  Discharge Needs  Concerns to be addressed:  Care Coordination Readmission within the last 30 days:  No Current discharge risk:  Physical Impairment Barriers to Discharge:   (pt is a [employer] pt and will take 24 hrs for auth for SNF placement.)   Chauncy Passy, LCSW 07/12/2015, 2:52 PM

## 2015-07-12 NOTE — Clinical Social Work Placement (Signed)
   CLINICAL SOCIAL WORK PLACEMENT  NOTE  Date:  07/12/2015  Patient Details  Name: Benjamin Kim MRN: [id number] Date of Birth: 1932/01/01  Clinical Social Work is seeking post-discharge placement for this patient at the Skilled  Nursing Facility level of care (*CSW will initial, date and re-position this form in  chart as items are completed):  Yes   Patient/family provided with [facility] Clinical Social Work Department's list of facilities offering this level of care within the geographic area requested by the patient (or if unable, by the patient's family).  Yes   Patient/family informed of their freedom to choose among providers that offer the needed level of care, that participate in Medicare, Medicaid or managed care program needed by the patient, have an available bed and are willing to accept the patient.  Yes   Patient/family informed of [facility]'s ownership interest in [facility] and [facility], as well as of the fact that they are under no obligation to receive care at these facilities.  PASRR submitted to EDS on       PASRR number received on       Existing PASRR number confirmed on       FL2 transmitted to all facilities in geographic area requested by pt/family on 07/12/15     FL2 transmitted to all facilities within larger geographic area on       Patient informed that his/her managed care company has contracts with or will negotiate with certain facilities, including the following:            Patient/family informed of bed offers received.  Patient chooses bed at       Physician recommends and patient chooses bed at      Patient to be transferred to   on  .  Patient to be transferred to facility by       Patient family notified on   of transfer.  Name of family member notified:        PHYSICIAN Please sign FL2     Additional Comment:    _______________________________________________ Chauncy Passy, LCSW 07/12/2015, 3:08 PM

## 2015-07-12 NOTE — Plan of Care (Signed)
Problem: Acute Rehab PT Goals(only PT should resolve) Goal: Pt will Roll Supine to Side Minimal verbal cues Goal: Pt Will Go Supine/Side To Sit Minimal verbal cues Goal: Pt Will Go Sit To Supine/Side Minimal verbal cues Goal: Patient Will Transfer Sit To/From Stand Minimal verbal cues

## 2015-07-12 NOTE — ED Provider Notes (Signed)
[facility] Emergency Department Provider Note  ____________________________________________  Time seen: Approximately 257 AM  I have reviewed the triage vital signs and the nursing notes.   HISTORY  Chief Complaint Weakness    HPI Benjamin Kim is a 81 y.o. male who is brought in by ambulance today for a fall. According to EMS and the patient's wife he fell this morning at 1:30 AM. The patient's wife reports that she feels is going to the bathroom. The patient has fallen multiple times in the last 2 days including 3 times yesterday. The patient's wife reports that he has been acting strange and seems more confused than normal. She also reports that for the past few days he's been having some slurred speech. The patient does not have dementia per his wife nor does he have any history with the similar symptoms. The patient's wife reports that he does not eat very well but she was concerned so she decided to send him in for evaluation. The patient is not complaining of any thing at this time but does seem confused and is unsure where he is or what going on. The patient has no vomiting no abdominal pain no chest pain or shortness of breath or blurred vision.   Past Medical History  Diagnosis Date  . Hypertension   . Hypercholesteremia     There are no active problems to display for this patient.   Past Surgical History  Procedure Laterality Date  . Prostate surgery      Current Outpatient Rx  Name  Route  Sig  Dispense  Refill  . buPROPion (WELLBUTRIN XL) 150 MG 24 hr tablet   Oral   Take 3 tablets by mouth daily.      4   . LORazepam (ATIVAN) 0.5 MG tablet   Oral   Take 0.5 mg by mouth 2 (two) times daily. Takes at 1pm and bedtime      2   . mirtazapine (REMERON) 15 MG tablet   Oral   Take 1 tablet by mouth at bedtime.      11   . temazepam (RESTORIL) 7.5 MG capsule   Oral   Take 7.5 mg by mouth at bedtime.      3   . venlafaxine XR  (EFFEXOR-XR) 75 MG 24 hr capsule   Oral   Take 150 mg by mouth every morning.      4     Allergies Ivp dye  No family history on file.  Social History Social History  Substance Use Topics  . Smoking status: Never Smoker   . Smokeless tobacco: Not on file  . Alcohol Use: No    Review of Systems Constitutional: No fever/chills Eyes: No visual changes. ENT: No sore throat. Cardiovascular: Denies chest pain. Respiratory: Denies shortness of breath. Gastrointestinal: No abdominal pain.  No nausea, no vomiting.  No diarrhea.  No constipation. Genitourinary: Negative for dysuria. Musculoskeletal: Negative for back pain. Skin: Negative for rash. Neurological: Confusion and slurred speech  10-point ROS otherwise negative.  ____________________________________________   PHYSICAL EXAM:  VITAL SIGNS: ED Triage Vitals  Enc Vitals Group     BP 07/12/15 0301 122/61 mmHg     Pulse Rate 07/12/15 0301 87     Resp 07/12/15 0301 18     Temp 07/12/15 0301 98.2 F (36.8 C)     Temp Source 07/12/15 0301 Oral     SpO2 07/12/15 0301 98 %     Weight 07/12/15 0301  131 lb 3 oz (59.506 kg)     Height --      Head Cir --      Peak Flow --      Pain Score --      Pain Loc --      Pain Edu? --      Excl. in GC? --     Constitutional: Alert and oriented to self. Well appearing and in no acute distress. Eyes: Conjunctivae are normal. PERRL. EOMI. Head: Atraumatic. Nose: No congestion/rhinnorhea. Mouth/Throat: Mucous membranes are moist.  Oropharynx non-erythematous. Cardiovascular: Normal rate, regular rhythm. Grossly normal heart sounds.  Good peripheral circulation. Respiratory: Normal respiratory effort.  No retractions. Lungs CTAB. Gastrointestinal: Soft and nontender. No distention. Positive bowel sounds Rectal: brown stool, heme positive control positive Musculoskeletal: No lower extremity tenderness nor edema.   Neurologic:  Normal speech and language. Confused No gross  focal neurologic deficits are appreciated.  Skin:  Skin is warm, dry and intact. No rash noted. Psychiatric: Mood and affect are normal.   ____________________________________________   LABS (all labs ordered are listed, but only abnormal results are displayed)  Labs Reviewed  CBC - Abnormal; Notable for the following:    RBC 2.84 (*)    Hemoglobin 7.4 (*)    HCT 23.2 (*)    MCHC 31.8 (*)    RDW 17.0 (*)    All other components within normal limits  COMPREHENSIVE METABOLIC PANEL - Abnormal; Notable for the following:    BUN 24 (*)    Creatinine, Ser 1.66 (*)    ALT 16 (*)    GFR calc non Af Amer 37 (*)    GFR calc Af Amer 43 (*)    All other components within normal limits  TROPONIN I  URINALYSIS COMPLETEWITH MICROSCOPIC ([facility] ONLY)  TROPONIN I  AMMONIA   ____________________________________________  EKG  none ____________________________________________  RADIOLOGY  CT head and cervical spine: No acute intracranial abnormalities, chronic atrophy and small vessel ischemic changes, diffuse degenerative change throughout the cervical spine, slight anterior subluxation of C& on T1 probably degenerative ____________________________________________   PROCEDURES  Procedure(s) performed: None  Critical Care performed: No  ____________________________________________   INITIAL IMPRESSION / ASSESSMENT AND PLAN / ED COURSE  Pertinent labs & imaging results that were available during my care of the patient were reviewed by me and considered in my medical decision making (see chart for details).  This is an 81 year old male who comes in with repetitive falls and confusion as well as some weakness. The patient does have some mild dehydration I did give him 500 ML's of normal saline. We are awaiting the patient's urinalysis to evaluate for possible UTI causing his altered mental status. I will also add on a pneumonia and an EKG. The patient is also anemic with heme positive  stool. He will likely be admitted to the hospitalist service.  The patient's care will be signed out to Dr. Scotty Court who will follow up the results and disposition the patient ____________________________________________   FINAL CLINICAL IMPRESSION(S) / ED DIAGNOSES  Final diagnoses:  Altered mental status, unspecified altered mental status type  Gastrointestinal hemorrhage, unspecified gastritis, unspecified gastrointestinal hemorrhage type      Rebecka Apley, MD 07/12/15 [phone]

## 2015-07-13 DIAGNOSIS — F0391 Unspecified dementia with behavioral disturbance: Secondary | ICD-10-CM

## 2015-07-13 DIAGNOSIS — F329 Major depressive disorder, single episode, unspecified: Secondary | ICD-10-CM

## 2015-07-13 DIAGNOSIS — F03918 Unspecified dementia, unspecified severity, with other behavioral disturbance: Secondary | ICD-10-CM

## 2015-07-13 DIAGNOSIS — F32A Depression, unspecified: Secondary | ICD-10-CM

## 2015-07-13 LAB — CBC
HEMATOCRIT: 21.6 % — AB (ref 40.0–52.0)
HEMOGLOBIN: 7.1 g/dL — AB (ref 13.0–18.0)
MCH: 26.8 pg (ref 26.0–34.0)
MCHC: 32.9 g/dL (ref 32.0–36.0)
MCV: 81.4 fL (ref 80.0–100.0)
PLATELETS: 314 10*3/uL (ref 150–440)
RBC: 2.66 MIL/uL — AB (ref 4.40–5.90)
RDW: 17 % — ABNORMAL HIGH (ref 11.5–14.5)
WBC: 6.2 10*3/uL (ref 3.8–10.6)

## 2015-07-13 LAB — BASIC METABOLIC PANEL
ANION GAP: 5 (ref 5–15)
BUN: 14 mg/dL (ref 6–20)
CHLORIDE: 110 mmol/L (ref 101–111)
CO2: 24 mmol/L (ref 22–32)
Calcium: 8.7 mg/dL — ABNORMAL LOW (ref 8.9–10.3)
Creatinine, Ser: 1.15 mg/dL (ref 0.61–1.24)
GFR calc non Af Amer: 58 mL/min — ABNORMAL LOW (ref >60)
Glucose, Bld: 77 mg/dL (ref 65–99)
POTASSIUM: 3.2 mmol/L — AB (ref 3.5–5.1)
SODIUM: 139 mmol/L (ref 135–145)

## 2015-07-13 LAB — PREPARE RBC (CROSSMATCH): ORDER CONFIRMATION: POSITIVE

## 2015-07-13 MED ORDER — SUCRALFATE 1 GM/10ML PO SUSP
1.0000 g | Freq: Three times a day (TID) | ORAL | Status: DC
  Administered 2015-07-13 – 2015-07-15 (×9): 1 g via ORAL
  Filled 2015-07-13 (×12): qty 10

## 2015-07-13 MED ORDER — BOOST / RESOURCE BREEZE PO LIQD
1.0000 | Freq: Two times a day (BID) | ORAL | Status: DC
  Administered 2015-07-13 – 2015-07-16 (×5): 1 via ORAL

## 2015-07-13 MED ORDER — PANTOPRAZOLE SODIUM 40 MG PO TBEC
40.0000 mg | DELAYED_RELEASE_TABLET | Freq: Two times a day (BID) | ORAL | Status: DC
  Administered 2015-07-13 – 2015-07-16 (×6): 40 mg via ORAL
  Filled 2015-07-13 (×6): qty 1

## 2015-07-13 MED ORDER — SODIUM CHLORIDE 0.9 % IV SOLN
Freq: Once | INTRAVENOUS | Status: AC
  Administered 2015-07-13: 15:00:00 via INTRAVENOUS

## 2015-07-13 MED ORDER — ACETAMINOPHEN 325 MG PO TABS
650.0000 mg | ORAL_TABLET | Freq: Once | ORAL | Status: AC
  Administered 2015-07-13: 650 mg via ORAL
  Filled 2015-07-13: qty 2

## 2015-07-13 MED ORDER — DIPHENHYDRAMINE HCL 25 MG PO CAPS
25.0000 mg | ORAL_CAPSULE | Freq: Once | ORAL | Status: AC
Start: 2015-07-13 — End: 2015-07-13
  Administered 2015-07-13: 25 mg via ORAL
  Filled 2015-07-13: qty 1

## 2015-07-13 NOTE — Progress Notes (Signed)
PT Cancellation Note  Patient Details Name: Benjamin Kim MRN: [id number] DOB: Oct 18, 1931   Cancelled Treatment:    Reason Eval/Treat Not Completed: Patient's level of consciousness;Fatigue/lethargy limiting ability to participate. Treatment attempted; pt lethargic and not responding to attempts at PT; continues to fall back to sleep. Re attempt either later today as schedule allows or tomorrow.    Elsie Stain Bishop 07/13/2015, 12:16 PM

## 2015-07-13 NOTE — Progress Notes (Signed)
[facility] - [place] at [facility]   PATIENT NAME: Foy Vanduyne    MR#:  [id number]  DATE OF BIRTH:  July 30, 1932  SUBJECTIVE:  CHIEF COMPLAINT:   Chief Complaint  Patient presents with  . Weakness    Per EMS pt has fallen x 3 since yesterday. States family reported that pt has had increased weakness and slurred speech.    Admitted for weakness, falls, confusion Confused at this time. Confusion seems to have worsened over the last 2 weeks. Has been working with his psychiatrist as outpatient. Parkinson's with psychosis was on the differential. REVIEW OF SYSTEMS:    Review of Systems  Unable to perform ROS: dementia    DRUG ALLERGIES:   Allergies  Allergen Reactions  . Ivp Dye [Iodinated Diagnostic Agents] Anaphylaxis    VITALS:  Blood pressure 142/70, pulse 86, temperature 98.7 F (37.1 C), temperature source Oral, resp. rate 18, height  (1.676 m), weight 60.646 kg (133 lb 11.2 oz), SpO2 99 %.  PHYSICAL EXAMINATION:   Physical Exam  GENERAL:  81 y.o.-year-old patient lying in the bed with no acute distress.  EYES: Pupils equal, round, reactive to light and accommodation. No scleral icterus. Extraocular muscles intact.  HEENT: Head atraumatic, normocephalic. Oropharynx and nasopharynx clear.  NECK:  Supple, no jugular venous distention. No thyroid enlargement, no tenderness.  LUNGS: Normal breath sounds bilaterally, no wheezing, rales, rhonchi. No use of accessory muscles of respiration.  CARDIOVASCULAR: S1, S2 normal. No murmurs, rubs, or gallops.  ABDOMEN: Soft, nontender, nondistended. Bowel sounds present. No organomegaly or mass.  EXTREMITIES: No cyanosis, clubbing or edema b/l.    NEUROLOGIC: Cranial nerves II through XII are intact. No focal Motor or sensory deficits b/l.   PSYCHIATRIC: The patient is awake and pleasantly confused SKIN: No obvious rash, lesion, or ulcer.    LABORATORY PANEL:   CBC  Recent Labs Lab  07/13/15 0531  WBC 6.2  HGB 7.1*  HCT 21.6*  PLT 314   ------------------------------------------------------------------------------------------------------------------  Chemistries   Recent Labs Lab 07/12/15 0328 07/13/15 0531  NA 138 139  K 3.8 3.2*  CL 105 110  CO2 27 24  GLUCOSE 76 77  BUN 24* 14  CREATININE 1.66* 1.15  CALCIUM 9.7 8.7*  AST 22  --   ALT 16*  --   ALKPHOS 48  --   BILITOT 0.7  --    ------------------------------------------------------------------------------------------------------------------  Cardiac Enzymes  Recent Labs Lab 07/12/15 0851  TROPONINI <0.03   ------------------------------------------------------------------------------------------------------------------  RADIOLOGY:  Ct Head Wo Contrast  07/12/2015   CLINICAL DATA:  Multiple falls since yesterday. Increased weakness and slurred speech. Altered mental status.  EXAM: CT HEAD WITHOUT CONTRAST  CT CERVICAL SPINE WITHOUT CONTRAST  TECHNIQUE: Multidetector CT imaging of the head and cervical spine was performed following the standard protocol without intravenous contrast. Multiplanar CT image reconstructions of the cervical spine were also generated.  COMPARISON:  CT head 01/05/2015  FINDINGS: CT HEAD FINDINGS  Diffuse cerebral atrophy. Ventricular dilatation consistent with central atrophy. Low-attenuation changes throughout the deep white matter consistent with small vessel ischemia. Old lacune in the right basal ganglia. No mass effect or midline shift. No abnormal extra-axial fluid collections. Gray-white matter junctions are distinct. Basal cisterns are not effaced. No evidence of acute intracranial hemorrhage. No depressed skull fractures. Visualized paranasal sinuses and mastoid air cells are not opacified. Subcutaneous scalp lesion over the right posterior parietal lesion appears to represent a lipoma. No change since prior study.  CT CERVICAL SPINE FINDINGS  Slight anterior  subluxation of C7 on T1 is likely degenerative but ligamentous injury not excluded. Otherwise normal alignment of the cervical spine. Diffuse multilevel degenerative changes with narrowed disc spaces and associated endplate hypertrophic changes. Coalition of C5-C6 and near coalition of C4 days C5 is likely degenerative. Degenerative changes throughout the cervical facet joints.  IMPRESSION: No acute intracranial abnormalities. Chronic atrophy and small vessel ischemic changes.  Diffuse degenerative change throughout the cervical spine. Slight anterior subluxation of C7 on T1 is probably degenerative but ligamentous injury not entirely excluded. No acute displaced fractures identified.   Electronically Signed   By: Burman Nieves M.D.   On: 07/12/2015 04:27   Ct Cervical Spine Wo Contrast  07/12/2015   CLINICAL DATA:  Multiple falls since yesterday. Increased weakness and slurred speech. Altered mental status.  EXAM: CT HEAD WITHOUT CONTRAST  CT CERVICAL SPINE WITHOUT CONTRAST  TECHNIQUE: Multidetector CT imaging of the head and cervical spine was performed following the standard protocol without intravenous contrast. Multiplanar CT image reconstructions of the cervical spine were also generated.  COMPARISON:  CT head 01/05/2015  FINDINGS: CT HEAD FINDINGS  Diffuse cerebral atrophy. Ventricular dilatation consistent with central atrophy. Low-attenuation changes throughout the deep white matter consistent with small vessel ischemia. Old lacune in the right basal ganglia. No mass effect or midline shift. No abnormal extra-axial fluid collections. Gray-white matter junctions are distinct. Basal cisterns are not effaced. No evidence of acute intracranial hemorrhage. No depressed skull fractures. Visualized paranasal sinuses and mastoid air cells are not opacified. Subcutaneous scalp lesion over the right posterior parietal lesion appears to represent a lipoma. No change since prior study.  CT CERVICAL SPINE FINDINGS   Slight anterior subluxation of C7 on T1 is likely degenerative but ligamentous injury not excluded. Otherwise normal alignment of the cervical spine. Diffuse multilevel degenerative changes with narrowed disc spaces and associated endplate hypertrophic changes. Coalition of C5-C6 and near coalition of C4 days C5 is likely degenerative. Degenerative changes throughout the cervical facet joints.  IMPRESSION: No acute intracranial abnormalities. Chronic atrophy and small vessel ischemic changes.  Diffuse degenerative change throughout the cervical spine. Slight anterior subluxation of C7 on T1 is probably degenerative but ligamentous injury not entirely excluded. No acute displaced fractures identified.   Electronically Signed   By: Burman Nieves M.D.   On: 07/12/2015 04:27   Dg Chest Portable 1 View  07/12/2015   CLINICAL DATA:  Fall, weakness, slurred speech  EXAM: PORTABLE CHEST 1 VIEW  COMPARISON:  None.  FINDINGS: Mild elevation of the left hemidiaphragm. Associated left basilar opacity, likely atelectasis, less likely pneumonia. No pleural effusion or pneumothorax.  The heart is normal in size.  IMPRESSION: Left basilar opacity, favored to reflect atelectasis, pneumonia not excluded.   Electronically Signed   By: Charline Bills M.D.   On: 07/12/2015 08:24     ASSESSMENT AND PLAN:   # Anemia: Likely from chronic GI losses. Upper GI series being considered by Dr. Marva Panda instead of EGD as per Wife's preference. 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 May be some component of dementia. Will consult psychiatry  # Hypertension  # Recurrent falls Likely from orthostatic BP drop. Also anemia contributing.  # ARF over CKD3 due to dehydration Improved with IVF. Stop.   All the records are reviewed and case discussed with Care Management/Social Workerr. Management  plans discussed with the  patient, family and they are in agreement.  CODE STATUS: DNR  DVT Prophylaxis: SCDs  TOTAL TIME TAKING CARE OF THIS PATIENT: 35 minutes.   POSSIBLE D/C IN 1-2 DAYS, DEPENDING ON CLINICAL CONDITION.   Milagros Loll R M.D on 07/13/2015 at 12:27 PM  Between 7am to 6pm - Pager - [phone]  After 6pm go to [email or web address] - password [email or web address]  [facility]  Office  [phone]  CC: Primary care physician; Lauro Regulus., MD    Note: This dictation was prepared with Dragon dictation along with smaller phrase technology. Any transcriptional errors that result from this process are unintentional.

## 2015-07-13 NOTE — Care Management (Signed)
Discussed care with Dr Sudini. Spouse has increased concerns of being able to manage care at home. PT has recommended SNF and is currently involved in care. Bed search in progress. Discharge plan  SNF. 

## 2015-07-13 NOTE — Progress Notes (Addendum)
Initial Nutrition Assessment  DOCUMENTATION CODES:   Severe malnutrition in context of chronic illness  INTERVENTION:   Medical Food Supplement Therapy: will recommend Boost Breeze po BID, each supplement provides 250 kcal and 9 grams of protein Meals and Snacks: Cater to patient preferences once able to advance diet order. Pt reports liking homemade milkshakes and ice cream, will send once diet able to be advanced.   NUTRITION DIAGNOSIS:   Inadequate oral intake related to poor appetite as evidenced by per patient/family report.  GOAL:   Patient will meet greater than or equal to 90% of their needs  MONITOR:    (Energy Intake, Digestive system, Anthropometrics, Electrolyte and Renal Profile)  REASON FOR ASSESSMENT:   Malnutrition Screening Tool    ASSESSMENT:   Pt admitted with AMS and iron deficiency anemia. Per MD note pt with large hiatal hernia on previous GI consult, likely may be scheduled for upper GI. Plan to transfuse pRBC secondary to low Hgb.  Past Medical History  Diagnosis Date  . Hypertension   . Hypercholesteremia   . Depressed   . CKD (chronic kidney disease) stage 3, GFR 30-59 ml/min   . Asthma   . Dementia   . Anxiety      Diet Order:  Diet clear liquid Room service appropriate?: Yes; Fluid consistency:: Thin    Current Nutrition: Pt reports having a jello this am with some juice. Recorded po intake 100% of CL trays.   Food/Nutrition-Related History: Pt wife reports very poor po intake for months PTA. Pt reports not usually eating breakfast and of lunch and dinner usually only eating 50% at most of those meals and agrees with wife it has been months since appetite has been good. Pt reports trying Boost but does not really like the milky flavors.    Medications: carafate, remeron, protonix  Electrolyte/Renal Profile and Glucose Profile:   Recent Labs Lab 07/12/15 0328 07/13/15 0531  NA 138 139  K 3.8 3.2*  CL 105 110  CO2 27 24  BUN  24* 14  CREATININE 1.66* 1.15  CALCIUM 9.7 8.7*  GLUCOSE 76 77   Protein Profile:   Recent Labs Lab 07/12/15 0328  ALBUMIN 3.7    Nutritional Anemia Profile:  CBC Latest Ref Rng 07/13/2015 07/12/2015  WBC 3.8 - 10.6 K/uL 6.2 6.5  Hemoglobin 13.0 - 18.0 g/dL 7.1(L) 7.4(L)  Hematocrit 40.0 - 52.0 % 21.6(L) 23.2(L)  Platelets 150 - 440 K/uL 314 383     Gastrointestinal Profile: Last BM: unknown   Nutrition-Focused Physical Exam Findings: Nutrition-Focused physical exam completed. Findings are no fat depletion, mild-moderate muscle depletion, and no edema.     Weight Change: Pt wife reports pt has lost 25lbs since his birthday this past [birth year] (16% weight loss in 6 months)   Skin:  Reviewed, no issues  Height:   Ht Readings from Last 1 Encounters:  07/12/15  (1.676 m)    Weight:   Wt Readings from Last 1 Encounters:  07/13/15 133 lb 11.2 oz (60.646 kg)     BMI:  Body mass index is 21.59 kg/(m^2).  Estimated Nutritional Needs:   Kcal:  BEE: 1249kcals, TEE: (IF 1.1-1.3)(AF 1.2) 1648-1948kcals  Protein:  49-61g protein (0.8-1.0g/kg)  Fluid:  1515-1857mL of fluid (25-44mL/kg)  EDUCATION NEEDS:   No education needs identified at this time   HIGH Care Level  Leda Quail, RD, LDN Pager [phone]

## 2015-07-13 NOTE — Consult Note (Signed)
[facility] Face-to-Face Psychiatry Consult   Reason for Consult:  Consult for this 81 year old man with a history of depression and anxiety. Concern about "psychosis" Referring Physician:  Sudini Patient Identification: Benjamin Kim MRN:  [id number] Principal Diagnosis: Dementia with behavioral disturbance Diagnosis:   Patient Active Problem List   Diagnosis Date Noted  . Dementia with behavioral disturbance [F03.91] 07/13/2015  . Depression [F32.9] 07/13/2015  . Anemia [D64.9] 07/12/2015  . Gastrointestinal bleeding [K92.2] 07/12/2015    Total Time spent with patient: 1 hour  Subjective:   Benjamin Kim is a 81 y.o. male patient admitted with "I don't know that I can do what you want me to do".  HPI:  Information from the patient from the chart and from the patient's wife and son. Wife and son are primary history and's as the patient's mental state precludes much useful history. The wife herself is a little bit confused in her history. She indicates that over some recent period of time possibly a couple weeks possibly a little bit more than that the patient has been increasingly confused. Evidently there were several falls prior to admission. She reports that his memory has been poor for some time. She doesn't think she can make a clear distinction between his current state and how it was prior to admission. His mood she feels is bad although it has not been as angry and irritable as it has been in the past. Wife is clear in blaming his changes in mental state on changes in medication. She believes his outpatient psychiatrist had made a mistake in discontinuing the patient's previous dose of Ativan. She also is ambivalent at best about whether his current antidepressives have been helpful. Patient was evidently also prescribed a new medication for the treatment of psychosis and Parkinson's disease but has been unable to buy it so far. Recently the suggestion was made of the possibility of  Parkinson's disease although that diagnosis seems uncertain. There has not been a clear new psychosocial stress. Medical illnesses have been worse recently. Patient has a severe anemia currently probably from some sort of GI bleed that left him very anemic when he came in to the hospital and may have worsened his mental state.  Past psychiatric history: History of long-standing depression and irritability. He has been followed for years at [facility] in [place] by a series of different psychiatrist. Most recently he is seeing Dr. Casimiro Needle. No history of suicide attempts. No history of psychiatric hospitalization. No prior history of any psychosis.  Social history: Patient lives with his wife. Both are elderly. Wife appears to be in much better condition than the patient is. There are several children and extended family in the area who are supportive.  Family history: No known family history of any mental illnesses identified.  Substance abuse history: Denies any recent drug or alcohol abuse or past known significant alcohol or drug abuse.  Medical history: Acute GI bleed and anemia. The diagnosis of Parkinson's disease was recently suggested by his outpatient psychiatrist but not confirmed.    Past Psychiatric History: See note above. He has been followed for years for a diagnosis apparently of depression. Prior medicines include a long-standing use of Ativan at higher doses. More recently that was tapered down and he has been treated with Wellbutrin and Effexor and mirtazapine combination. No history of suicide attempts or hospitalization.  Risk to Self: Is patient at risk for suicide?: No Risk to Others:   Prior Inpatient Therapy:  Prior Outpatient Therapy:    Past Medical History:  Past Medical History  Diagnosis Date  . Hypertension   . Hypercholesteremia   . Depressed   . CKD (chronic kidney disease) stage 3, GFR 30-59 ml/min   . Asthma   . Dementia   . Anxiety      Past Surgical History  Procedure Laterality Date  . Prostate surgery     Family History:  Family History  Problem Relation Age of Onset  . Lung cancer Father    Family Psychiatric  History: No identified family history of any mental health problems Social History:  History  Alcohol Use No     History  Drug Use Not on file    Social History   Social History  . Marital Status: Married    Spouse Name: N/A  . Number of Children: N/A  . Years of Education: N/A   Social History Main Topics  . Smoking status: Never Smoker   . Smokeless tobacco: None  . Alcohol Use: No  . Drug Use: None  . Sexual Activity: Not Asked   Other Topics Concern  . None   Social History Narrative   Additional Social History:                          Allergies:   Allergies  Allergen Reactions  . Ivp Dye [Iodinated Diagnostic Agents] Anaphylaxis    Labs:  Results for orders placed or performed during the hospital encounter of 07/12/15 (from the past 48 hour(s))  CBC     Status: Abnormal   Collection Time: 07/12/15  3:28 AM  Result Value Ref Range   WBC 6.5 3.8 - 10.6 K/uL   RBC 2.84 (L) 4.40 - 5.90 MIL/uL   Hemoglobin 7.4 (L) 13.0 - 18.0 g/dL   HCT 23.2 (L) 40.0 - 52.0 %   MCV 81.8 80.0 - 100.0 fL   MCH 26.0 26.0 - 34.0 pg   MCHC 31.8 (L) 32.0 - 36.0 g/dL   RDW 17.0 (H) 11.5 - 14.5 %   Platelets 383 150 - 440 K/uL  Comprehensive metabolic panel     Status: Abnormal   Collection Time: 07/12/15  3:28 AM  Result Value Ref Range   Sodium 138 135 - 145 mmol/L   Potassium 3.8 3.5 - 5.1 mmol/L   Chloride 105 101 - 111 mmol/L   CO2 27 22 - 32 mmol/L   Glucose, Bld 76 65 - 99 mg/dL   BUN 24 (H) 6 - 20 mg/dL   Creatinine, Ser 1.66 (H) 0.61 - 1.24 mg/dL   Calcium 9.7 8.9 - 10.3 mg/dL   Total Protein 6.6 6.5 - 8.1 g/dL   Albumin 3.7 3.5 - 5.0 g/dL   AST 22 15 - 41 U/L   ALT 16 (L) 17 - 63 U/L   Alkaline Phosphatase 48 38 - 126 U/L   Total Bilirubin 0.7 0.3 - 1.2 mg/dL   GFR  calc non Af Amer 37 (L) >60 mL/min   GFR calc Af Amer 43 (L) >60 mL/min    Comment: (NOTE) The eGFR has been calculated using the CKD EPI equation. This calculation has not been validated in all clinical situations. eGFR's persistently <60 mL/min signify possible Chronic Kidney Disease.    Anion gap 6 5 - 15  Troponin I     Status: None   Collection Time: 07/12/15  3:28 AM  Result Value Ref Range   Troponin I <  0.03 <0.031 ng/mL    Comment:        NO INDICATION OF MYOCARDIAL INJURY.   Vitamin B12     Status: None   Collection Time: 07/12/15  3:28 AM  Result Value Ref Range   Vitamin B-12 774 180 - 914 pg/mL    Comment: (NOTE) This assay is not validated for testing neonatal or myeloproliferative syndrome specimens for Vitamin B12 levels. Performed at [facility]   Folate     Status: None   Collection Time: 07/12/15  3:28 AM  Result Value Ref Range   Folate 12.0 >5.9 ng/mL  Iron and TIBC     Status: Abnormal   Collection Time: 07/12/15  3:28 AM  Result Value Ref Range   Iron 14 (L) 45 - 182 ug/dL   TIBC 376 250 - 450 ug/dL   Saturation Ratios 4 (L) 17.9 - 39.5 %   UIBC 362 ug/dL  Ferritin     Status: Abnormal   Collection Time: 07/12/15  3:28 AM  Result Value Ref Range   Ferritin 5 (L) 24 - 336 ng/mL  Reticulocytes     Status: Abnormal   Collection Time: 07/12/15  3:28 AM  Result Value Ref Range   Retic Ct Pct 1.3 0.4 - 3.1 %   RBC. 2.90 (L) 4.40 - 5.90 MIL/uL   Retic Count, Manual 37.7 19.0 - 183.0 K/uL  Urinalysis complete, with microscopic ([facility] only)     Status: Abnormal   Collection Time: 07/12/15  8:51 AM  Result Value Ref Range   Color, Urine YELLOW (A) YELLOW   APPearance CLEAR (A) CLEAR   Glucose, UA NEGATIVE NEGATIVE mg/dL   Bilirubin Urine NEGATIVE NEGATIVE   Ketones, ur TRACE (A) NEGATIVE mg/dL   Specific Gravity, Urine 1.019 1.005 - 1.030   Hgb urine dipstick NEGATIVE NEGATIVE   pH 6.0 5.0 - 8.0   Protein, ur NEGATIVE NEGATIVE mg/dL    Nitrite NEGATIVE NEGATIVE   Leukocytes, UA NEGATIVE NEGATIVE   RBC / HPF NONE SEEN 0 - 5 RBC/hpf   WBC, UA 0-5 0 - 5 WBC/hpf   Bacteria, UA NONE SEEN NONE SEEN   Squamous Epithelial / LPF NONE SEEN NONE SEEN   Mucous PRESENT   Troponin I     Status: None   Collection Time: 07/12/15  8:51 AM  Result Value Ref Range   Troponin I <0.03 <0.031 ng/mL    Comment:        NO INDICATION OF MYOCARDIAL INJURY.   Ammonia     Status: None   Collection Time: 07/12/15  8:51 AM  Result Value Ref Range   Ammonia 16 9 - 35 umol/L  Basic metabolic panel     Status: Abnormal   Collection Time: 07/13/15  5:31 AM  Result Value Ref Range   Sodium 139 135 - 145 mmol/L   Potassium 3.2 (L) 3.5 - 5.1 mmol/L   Chloride 110 101 - 111 mmol/L   CO2 24 22 - 32 mmol/L   Glucose, Bld 77 65 - 99 mg/dL   BUN 14 6 - 20 mg/dL   Creatinine, Ser 1.15 0.61 - 1.24 mg/dL   Calcium 8.7 (L) 8.9 - 10.3 mg/dL   GFR calc non Af Amer 58 (L) >60 mL/min   GFR calc Af Amer >60 >60 mL/min    Comment: (NOTE) The eGFR has been calculated using the CKD EPI equation. This calculation has not been validated in all clinical situations. eGFR's persistently <60 mL/min  signify possible Chronic Kidney Disease.    Anion gap 5 5 - 15  CBC     Status: Abnormal   Collection Time: 07/13/15  5:31 AM  Result Value Ref Range   WBC 6.2 3.8 - 10.6 K/uL   RBC 2.66 (L) 4.40 - 5.90 MIL/uL   Hemoglobin 7.1 (L) 13.0 - 18.0 g/dL   HCT 21.6 (L) 40.0 - 52.0 %   MCV 81.4 80.0 - 100.0 fL   MCH 26.8 26.0 - 34.0 pg   MCHC 32.9 32.0 - 36.0 g/dL   RDW 17.0 (H) 11.5 - 14.5 %   Platelets 314 150 - 440 K/uL  Type and screen     Status: None (Preliminary result)   Collection Time: 07/13/15 12:13 PM  Result Value Ref Range   ABO/RH(D) O POS    Antibody Screen NEG    Sample Expiration 07/16/2015    Unit Number [id number]    Blood Component Type RBC, LR IRR    Unit division 00    Status of Unit ISSUED    Transfusion Status OK TO TRANSFUSE     Crossmatch Result Compatible   ABO/Rh     Status: None   Collection Time: 07/13/15 12:13 PM  Result Value Ref Range   ABO/RH(D) O POS   Prepare RBC     Status: None   Collection Time: 07/13/15 12:13 PM  Result Value Ref Range   Order Confirmation ORDER PROCESSED BY BLOOD BANK POS     Current Facility-Administered Medications  Medication Dose Route Frequency Provider Last Rate Last Dose  . acetaminophen (TYLENOL) tablet 650 mg  650 mg Oral Q6H PRN Hillary Bow, MD       Or  . acetaminophen (TYLENOL) suppository 650 mg  650 mg Rectal Q6H PRN Srikar Sudini, MD      . albuterol (PROVENTIL) (2.5 MG/3ML) 0.083% nebulizer solution 2.5 mg  2.5 mg Nebulization Q2H PRN Srikar Sudini, MD      . buPROPion (WELLBUTRIN XL) 24 hr tablet 450 mg  450 mg Oral Daily Hillary Bow, MD   450 mg at 07/13/15 1029  . feeding supplement (BOOST / RESOURCE BREEZE) liquid 1 Container  1 Container Oral BID WC Hillary Bow, MD   1 Container at 07/13/15 1846  . mirtazapine (REMERON) tablet 15 mg  15 mg Oral QHS Hillary Bow, MD   15 mg at 07/12/15 2122  . ondansetron (ZOFRAN) tablet 4 mg  4 mg Oral Q6H PRN Hillary Bow, MD       Or  . ondansetron (ZOFRAN) injection 4 mg  4 mg Intravenous Q6H PRN Srikar Sudini, MD      . pantoprazole (PROTONIX) EC tablet 40 mg  40 mg Oral BID AC Srikar Sudini, MD   40 mg at 07/13/15 1745  . senna-docusate (Senokot-S) tablet 1 tablet  1 tablet Oral QHS PRN Srikar Sudini, MD      . sodium chloride 0.9 % injection 3 mL  3 mL Intravenous Q12H Srikar Sudini, MD   3 mL at 07/12/15 1000  . sucralfate (CARAFATE) 1 GM/10ML suspension 1 g  1 g Oral TID WC & HS Srikar Sudini, MD   1 g at 07/13/15 1745  . venlafaxine XR (EFFEXOR-XR) 24 hr capsule 150 mg  150 mg Oral q morning - 10a Hillary Bow, MD   150 mg at 07/13/15 1029    Musculoskeletal: Strength & Muscle Tone: decreased Gait & Station: unable to stand Patient leans: N/A  Psychiatric Specialty Exam: Review  of Systems  Unable to  perform ROS   Blood pressure 143/75, pulse 89, temperature 98.2 F (36.8 C), temperature source Axillary, resp. rate 18, height '5\' 6"'  (1.676 m), weight 60.646 kg (133 lb 11.2 oz), SpO2 98 %.Body mass index is 21.59 kg/(m^2).  General Appearance: Casual  Eye Contact::  Minimal  Speech:  Slow  Volume:  Decreased  Mood:  Dysphoric  Affect:  Flat  Thought Process:  Loose  Orientation:  Negative  Thought Content:  Negative  Suicidal Thoughts:  No  Homicidal Thoughts:  No  Memory:  Immediate;   Poor Recent;   Poor Remote;   Poor  Judgement:  Poor  Insight:  Lacking  Psychomotor Activity:  Decreased  Concentration:  Poor  Recall:  Poor  Fund of Knowledge:Poor  Language: Poor  Akathisia:  No  Handed:  Right  AIMS (if indicated):     Assets:  Financial Resources/Insurance Housing Social Support  ADL's:  Impaired  Cognition: Impaired,  Moderate and Severe  Sleep:      Treatment Plan Summary: Medication management and Plan Patient presents as an elderly man who appears to be extremely demented. It was very difficult to communicate with him. When I spoke directly to him he would stare at me in a confused manner as though he were trying to figure out who I was and why I was in the room. With much encouragement from his family he would mutter a few answers to them but not to me. He eventually was able to say his name. With his wife's prompting he was able to name other members of his family. He has no idea where he is or what his situation is more about the date or time. More complete Mini-Mental status was not attempted it's clear that he is quite demented. May evidence occasional psychotic symptoms as part of his dementia. Dr. Darnelle Spangle has put forward the possibility of Parkinson's disease based on his flat affect and general slowing. It's possible although he doesn't really seem to have much of a tremor. It's possible that this may be a worsening of his underlying depression as well. At this  point however his medical illness and the inevitable delirium is such that I would not make many changes to his psychiatric medicine since he is already on antidepressives. Continue his current combination of antidepressives. Follow-up as needed.  Disposition: Patient does not meet criteria for psychiatric inpatient admission. Supportive therapy provided about ongoing stressors.  John Clapacs 07/13/2015 7:52 PM

## 2015-07-13 NOTE — Consult Note (Signed)
Subjective: Patient seen for anemia and heme positive stool. Further difficulties re dementia, confusion, possible parkinsons related psychosis. No nausea emesis or abdominal pain.   Objective: Vital signs in last 24 hours: Temp:  [97.7 F (36.5 C)-98.8 F (37.1 C)] 98.1 F (36.7 C) (09/26 2124) Pulse Rate:  [85-95] 95 (09/26 2211) Resp:  [14-20] 14 (09/26 2124) BP: (126-154)/(54-126) 152/77 mmHg (09/26 2211) SpO2:  [98 %-100 %] 98 % (09/26 2124) Weight:  [60.646 kg (133 lb 11.2 oz)] 60.646 kg (133 lb 11.2 oz) (09/26 0427) Blood pressure 152/77, pulse 95, temperature 98.1 F (36.7 C), temperature source Oral, resp. rate 14, height  (1.676 m), weight 60.646 kg (133 lb 11.2 oz), SpO2 98 %.   Intake/Output from previous day: 09/25 0701 - 09/26 0700 In: 1148 [P.O.:600; I.V.:548] Out: 1275 [Urine:1275]  Intake/Output this shift: Total I/O In: 0  Out: 225 [Urine:225]   General appearance:  78 male no distress Resp:  cta Cardio:  rrr GI:  Soft, nontender nondistended bowel sounds positive Extremities:  No cce   Lab Results: Results for orders placed or performed during the hospital encounter of 07/12/15 (from the past 24 hour(s))  Basic metabolic panel     Status: Abnormal   Collection Time: 07/13/15  5:31 AM  Result Value Ref Range   Sodium 139 135 - 145 mmol/L   Potassium 3.2 (L) 3.5 - 5.1 mmol/L   Chloride 110 101 - 111 mmol/L   CO2 24 22 - 32 mmol/L   Glucose, Bld 77 65 - 99 mg/dL   BUN 14 6 - 20 mg/dL   Creatinine, Ser 4.54 0.61 - 1.24 mg/dL   Calcium 8.7 (L) 8.9 - 10.3 mg/dL   GFR calc non Af Amer 58 (L) >60 mL/min   GFR calc Af Amer >60 >60 mL/min   Anion gap 5 5 - 15  CBC     Status: Abnormal   Collection Time: 07/13/15  5:31 AM  Result Value Ref Range   WBC 6.2 3.8 - 10.6 K/uL   RBC 2.66 (L) 4.40 - 5.90 MIL/uL   Hemoglobin 7.1 (L) 13.0 - 18.0 g/dL   HCT 09.8 (L) 11.9 - 14.7 %   MCV 81.4 80.0 - 100.0 fL   MCH 26.8 26.0 - 34.0 pg   MCHC 32.9 32.0 -  36.0 g/dL   RDW 82.9 (H) 56.2 - 13.0 %   Platelets 314 150 - 440 K/uL  Type and screen     Status: None (Preliminary result)   Collection Time: 07/13/15 12:13 PM  Result Value Ref Range   ABO/RH(D) O POS    Antibody Screen NEG    Sample Expiration 07/16/2015    Unit Number [id number]    Blood Component Type RBC, LR IRR    Unit division 00    Status of Unit ISSUED    Transfusion Status OK TO TRANSFUSE    Crossmatch Result Compatible   ABO/Rh     Status: None   Collection Time: 07/13/15 12:13 PM  Result Value Ref Range   ABO/RH(D) O POS   Prepare RBC     Status: None   Collection Time: 07/13/15 12:13 PM  Result Value Ref Range   Order Confirmation ORDER PROCESSED BY BLOOD BANK POS       Recent Labs  07/12/15 0328 07/13/15 0531  WBC 6.5 6.2  HGB 7.4* 7.1*  HCT 23.2* 21.6*  PLT 383 314   BMET  Recent Labs  07/12/15 0328 07/13/15 0531  NA 138 139  K 3.8 3.2*  CL 105 110  CO2 27 24  GLUCOSE 76 77  BUN 24* 14  CREATININE 1.66* 1.15  CALCIUM 9.7 8.7*   LFT  Recent Labs  07/12/15 0328  PROT 6.6  ALBUMIN 3.7  AST 22  ALT 16*  ALKPHOS 48  BILITOT 0.7   PT/INR No results for input(s): LABPROT, INR in the last 72 hours. Hepatitis Panel No results for input(s): HEPBSAG, HCVAB, HEPAIGM, HEPBIGM in the last 72 hours. C-Diff No results for input(s): CDIFFTOX in the last 72 hours. No results for input(s): CDIFFPCR in the last 72 hours.   Studies/Results: Ct Head Wo Contrast  07/12/2015   CLINICAL DATA:  Multiple falls since yesterday. Increased weakness and slurred speech. Altered mental status.  EXAM: CT HEAD WITHOUT CONTRAST  CT CERVICAL SPINE WITHOUT CONTRAST  TECHNIQUE: Multidetector CT imaging of the head and cervical spine was performed following the standard protocol without intravenous contrast. Multiplanar CT image reconstructions of the cervical spine were also generated.  COMPARISON:  CT head 01/05/2015  FINDINGS: CT HEAD FINDINGS  Diffuse cerebral  atrophy. Ventricular dilatation consistent with central atrophy. Low-attenuation changes throughout the deep white matter consistent with small vessel ischemia. Old lacune in the right basal ganglia. No mass effect or midline shift. No abnormal extra-axial fluid collections. Gray-white matter junctions are distinct. Basal cisterns are not effaced. No evidence of acute intracranial hemorrhage. No depressed skull fractures. Visualized paranasal sinuses and mastoid air cells are not opacified. Subcutaneous scalp lesion over the right posterior parietal lesion appears to represent a lipoma. No change since prior study.  CT CERVICAL SPINE FINDINGS  Slight anterior subluxation of C7 on T1 is likely degenerative but ligamentous injury not excluded. Otherwise normal alignment of the cervical spine. Diffuse multilevel degenerative changes with narrowed disc spaces and associated endplate hypertrophic changes. Coalition of C5-C6 and near coalition of C4 days C5 is likely degenerative. Degenerative changes throughout the cervical facet joints.  IMPRESSION: No acute intracranial abnormalities. Chronic atrophy and small vessel ischemic changes.  Diffuse degenerative change throughout the cervical spine. Slight anterior subluxation of C7 on T1 is probably degenerative but ligamentous injury not entirely excluded. No acute displaced fractures identified.   Electronically Signed   By: Burman Nieves M.D.   On: 07/12/2015 04:27   Ct Cervical Spine Wo Contrast  07/12/2015   CLINICAL DATA:  Multiple falls since yesterday. Increased weakness and slurred speech. Altered mental status.  EXAM: CT HEAD WITHOUT CONTRAST  CT CERVICAL SPINE WITHOUT CONTRAST  TECHNIQUE: Multidetector CT imaging of the head and cervical spine was performed following the standard protocol without intravenous contrast. Multiplanar CT image reconstructions of the cervical spine were also generated.  COMPARISON:  CT head 01/05/2015  FINDINGS: CT HEAD FINDINGS   Diffuse cerebral atrophy. Ventricular dilatation consistent with central atrophy. Low-attenuation changes throughout the deep white matter consistent with small vessel ischemia. Old lacune in the right basal ganglia. No mass effect or midline shift. No abnormal extra-axial fluid collections. Gray-white matter junctions are distinct. Basal cisterns are not effaced. No evidence of acute intracranial hemorrhage. No depressed skull fractures. Visualized paranasal sinuses and mastoid air cells are not opacified. Subcutaneous scalp lesion over the right posterior parietal lesion appears to represent a lipoma. No change since prior study.  CT CERVICAL SPINE FINDINGS  Slight anterior subluxation of C7 on T1 is likely degenerative but ligamentous injury not excluded. Otherwise normal alignment of the cervical spine. Diffuse multilevel degenerative changes with  narrowed disc spaces and associated endplate hypertrophic changes. Coalition of C5-C6 and near coalition of C4 days C5 is likely degenerative. Degenerative changes throughout the cervical facet joints.  IMPRESSION: No acute intracranial abnormalities. Chronic atrophy and small vessel ischemic changes.  Diffuse degenerative change throughout the cervical spine. Slight anterior subluxation of C7 on T1 is probably degenerative but ligamentous injury not entirely excluded. No acute displaced fractures identified.   Electronically Signed   By: Burman Nieves M.D.   On: 07/12/2015 04:27   Dg Chest Portable 1 View  07/12/2015   CLINICAL DATA:  Fall, weakness, slurred speech  EXAM: PORTABLE CHEST 1 VIEW  COMPARISON:  None.  FINDINGS: Mild elevation of the left hemidiaphragm. Associated left basilar opacity, likely atelectasis, less likely pneumonia. No pleural effusion or pneumothorax.  The heart is normal in size.  IMPRESSION: Left basilar opacity, favored to reflect atelectasis, pneumonia not excluded.   Electronically Signed   By: Charline Bills M.D.   On:  07/12/2015 08:24    Scheduled Inpatient Medications:   . buPROPion  450 mg Oral Daily  . feeding supplement  1 Container Oral BID WC  . mirtazapine  15 mg Oral QHS  . pantoprazole  40 mg Oral BID AC  . sodium chloride  3 mL Intravenous Q12H  . sucralfate  1 g Oral TID WC & HS  . venlafaxine XR  150 mg Oral q morning - 10a    Continuous Inpatient Infusions:     PRN Inpatient Medications:  acetaminophen **OR** acetaminophen, albuterol, ondansetron **OR** ondansetron (ZOFRAN) IV, senna-docusate  Miscellaneous:  Assessment:  1. Anemia and heme positive stool.  Poor candidate for sedated evaluations, wife prefering noninvasive sedated evaluation .  Patient with h/o large hiatal hernia with partially intrathoracic hiatal hernia.   UGIS ordered for tomorrow.  It may be clinically difficult to complete due to patient mental status.   Plan:  1. As above  Christena Deem MD 07/13/2015, 10:13 PM

## 2015-07-14 ENCOUNTER — Inpatient Hospital Stay: Payer: Medicare PPO

## 2015-07-14 DIAGNOSIS — E43 Unspecified severe protein-calorie malnutrition: Secondary | ICD-10-CM | POA: Diagnosis present

## 2015-07-14 LAB — TYPE AND SCREEN
ABO/RH(D): O POS
Antibody Screen: NEGATIVE
UNIT DIVISION: 0

## 2015-07-14 LAB — HEMOGLOBIN: Hemoglobin: 8.4 g/dL — ABNORMAL LOW (ref 13.0–18.0)

## 2015-07-14 MED ORDER — SENNOSIDES-DOCUSATE SODIUM 8.6-50 MG PO TABS
2.0000 | ORAL_TABLET | Freq: Two times a day (BID) | ORAL | Status: DC
  Administered 2015-07-14 – 2015-07-15 (×3): 2 via ORAL
  Filled 2015-07-14 (×3): qty 2

## 2015-07-14 MED ORDER — FERROUS SULFATE 325 (65 FE) MG PO TABS
325.0000 mg | ORAL_TABLET | Freq: Two times a day (BID) | ORAL | Status: DC
  Administered 2015-07-14 – 2015-07-16 (×4): 325 mg via ORAL
  Filled 2015-07-14 (×4): qty 1

## 2015-07-14 MED ORDER — HALOPERIDOL LACTATE 5 MG/ML IJ SOLN
5.0000 mg | Freq: Once | INTRAMUSCULAR | Status: AC
  Administered 2015-07-14: 5 mg via INTRAVENOUS
  Filled 2015-07-14: qty 1

## 2015-07-14 MED ORDER — POLYETHYLENE GLYCOL 3350 17 G PO PACK
17.0000 g | PACK | Freq: Every day | ORAL | Status: DC | PRN
  Administered 2015-07-14: 17 g via ORAL
  Filled 2015-07-14: qty 1

## 2015-07-14 NOTE — Progress Notes (Signed)
[facility] - [place] at [facility]   PATIENT NAME: Addis Tuohy    MR#:  [id number]  DATE OF BIRTH:  [birth year]  SUBJECTIVE:  CHIEF COMPLAINT:   Chief Complaint  Patient presents with  . Weakness    Per EMS pt has fallen x 3 since yesterday. States family reported that pt has had increased weakness and slurred speech.    Admitted for weakness, falls, confusion Confused at this time. Upper GI series done earlier. On clears. Wife at bedside. REVIEW OF SYSTEMS:    Review of Systems  Unable to perform ROS: dementia    DRUG ALLERGIES:   Allergies  Allergen Reactions  . Ivp Dye [Iodinated Diagnostic Agents] Anaphylaxis    VITALS:  Blood pressure 122/93, pulse 98, temperature 97.5 F (36.4 C), temperature source Oral, resp. rate 14, height  (1.676 m), weight 60.328 kg (133 lb), SpO2 100 %.  PHYSICAL EXAMINATION:   Physical Exam  GENERAL:  81 y.o.-year-old patient lying in the bed with no acute distress.  EYES: Pupils equal, round, reactive to light and accommodation. No scleral icterus. Extraocular muscles intact.  HEENT: Head atraumatic, normocephalic. Oropharynx and nasopharynx clear.  NECK:  Supple, no jugular venous distention. No thyroid enlargement, no tenderness.  LUNGS: Normal breath sounds bilaterally, no wheezing, rales, rhonchi. No use of accessory muscles of respiration.  CARDIOVASCULAR: S1, S2 normal. No murmurs, rubs, or gallops.  ABDOMEN: Soft, nontender, nondistended. Bowel sounds present. No organomegaly or mass.  EXTREMITIES: No cyanosis, clubbing or edema b/l.    NEUROLOGIC: Cranial nerves II through XII are intact. No focal Motor or sensory deficits b/l.   PSYCHIATRIC: The patient is awake and pleasantly confused SKIN: No obvious rash, lesion, or ulcer.    LABORATORY PANEL:   CBC  Recent Labs Lab 07/13/15 0531 07/14/15 1003  WBC 6.2  --   HGB 7.1* 8.4*  HCT 21.6*  --   PLT 314  --     ------------------------------------------------------------------------------------------------------------------  Chemistries   Recent Labs Lab 07/12/15 0328 07/13/15 0531  NA 138 139  K 3.8 3.2*  CL 105 110  CO2 27 24  GLUCOSE 76 77  BUN 24* 14  CREATININE 1.66* 1.15  CALCIUM 9.7 8.7*  AST 22  --   ALT 16*  --   ALKPHOS 48  --   BILITOT 0.7  --    ------------------------------------------------------------------------------------------------------------------  Cardiac Enzymes  Recent Labs Lab 07/12/15 0851  TROPONINI <0.03   ------------------------------------------------------------------------------------------------------------------  RADIOLOGY:  Antony Haste  W/kub  07/14/2015   CLINICAL DATA:  Patient reports difficulty eating and not feeling well, history of anemia  EXAM: UPPER GI SERIES WITHOUT KUB  TECHNIQUE: Routine upper GI series was begun using thin barium. However, the patient was unable to tolerate more than a sip or 2 of the barium.  FLUOROSCOPY TIME:  Fluoroscopy Time (in minutes and seconds): 0 minutes, 24 seconds  Number of Acquired Images:  2  COMPARISON:  None in PACs  FINDINGS: The patient ingested only a very small amount of barium before indicating that he was getting sick an would not ingest additional barium. The patient was quite weak and positioning was limited. There is tortuosity of the distal 1/2 of the esophagus. There is a probable hiatal hernia-partially intrathoracic stomach.  IMPRESSION: Very limited study which is nondiagnostic. There is an abnormal appearance of the distal esophagus which may reflect a hiatal hernia-partially intra thoracic stomach. There is marked tortuosity of the distal 1/2 of the esophagus.  CT scanning of the chest, abdomen, and pelvis is recommended.   Electronically Signed   By: David  [place]aziland M.D.   On: 07/14/2015 11:44     ASSESSMENT AND PLAN:   # Anemia: Likely from chronic GI losses. Upper GI series showed  large hiatal hernia and tortuous esophagus. Discussed with Dr. Marva Panda. Will check CT scan of the chest abdomen pelvis to look for any colon cancer or abnormalities. No plans on EGD or colonoscopy. Monitor hemoglobin. Patient is on PPI and Carafate. Start iron supplementation. We'll advance diet to a regular diet.  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 Appreciate psychiatry help in the hospital.  # Hypertension  # Recurrent falls Likely from orthostatic BP drop. Also anemia contributing. Physical therapy recommending skilled nursing facility.  # ARF over CKD3 due to dehydration Improved with IVF. Stopped  # Severe protein calorie malnutrition On supplementation.   All the records are reviewed and case discussed with Care Management/Social Workerr. Management plans discussed with the patient, family and they are in agreement.  CODE STATUS: DNR  DVT Prophylaxis: SCDs  TOTAL TIME TAKING CARE OF THIS PATIENT: 35 minutes.   POSSIBLE D/C IN 1-2 DAYS, DEPENDING ON CLINICAL CONDITION.   Milagros Loll R M.D on 07/14/2015 at 12:30 PM  Between 7am to 6pm - Pager - [phone]  After 6pm go to [email or web address] - password [email or web address]  [facility]  Office  [phone]  CC: Primary care physician; Lauro Regulus., MD    Note: This dictation was prepared with Dragon dictation along with smaller phrase technology. Any transcriptional errors that result from this process are unintentional.

## 2015-07-14 NOTE — Progress Notes (Signed)
CSW met with Pt and his wife to discuss SNF options. Pt's wife has selected [facility] [employer]. Pt's insurance is pending for prior approval for SNF at dc.  Pt's pasrr is also pending. Pt is for possible dc on Wednesday per MD.    CSW will continue to follow to assist with dc coordination to SNF.   Toma Copier, [place]

## 2015-07-14 NOTE — Progress Notes (Signed)
Physical Therapy Treatment Patient Details Name: Benjamin Kim MRN: [id number] DOB: 1931/10/20 Today's Date: 07/14/2015    History of Present Illness Patient is an 81 y.o. male who was seen in ED on 25 September after sustaining multiple falls and experiencing progressive weakness over past 3 weeks.    PT Comments    Pt much more responsive today; answers questions logically. Pt participates well in bed exercises with assist and increased instruction as needed. Pt also able to demonstrate transfers up in bed and into stand. Pt notes weakness and fatigue with stand. Spouse present and notes pt uses a walker at home part of the time for transfers, but does not walk. Pt stood with hand held assist; will place rolling walker in pt's room. Continue PT to progress strength, endurance, and all functional mobility.  Follow Up Recommendations  SNF     Equipment Recommendations  Rolling walker with 5" wheels    Recommendations for Other Services       Precautions / Restrictions Precautions Precautions: Fall Restrictions Weight Bearing Restrictions: No    Mobility  Bed Mobility Overal bed mobility: Needs Assistance Bed Mobility: Supine to Sit     Supine to sit: Min assist     General bed mobility comments: Increased time/instruction to move legs off edge of bed and to scoot to edge of bed  Transfers Overall transfer level: Needs assistance Equipment used: None;1 person hand held assist Transfers: Sit to/from Stand Sit to Stand: Min assist (2x)         General transfer comment: spouse in room and notes pt use rw part of the time at home. Will put rw in patients room for transfers and potential ambulation  Ambulation/Gait             General Gait Details: Not tested; pt too fatigued second stand to attempt ambulation   Stairs            Wheelchair Mobility    Modified Rankin (Stroke Patients Only)       Balance                                     Cognition Arousal/Alertness: Awake/alert Behavior During Therapy: WFL for tasks assessed/performed Overall Cognitive Status: History of cognitive impairments - at baseline     Current Attention Level: Focused   Following Commands: Follows one step commands with increased time       General Comments: Improved response today; pt responds to name, follows one step commands with increased time/instruction initially and appropriately responds to questions     Exercises General Exercises - Lower Extremity Ankle Circles/Pumps: AAROM;Both;20 reps;Supine Short Arc Quad: Both;20 reps;AAROM;Supine (tactile cues required for flexion/extension movement desired) Heel Slides: AAROM;Both;15 reps;Supine Hip ABduction/ADduction: AAROM;Both;15 reps;Supine Straight Leg Raises: AAROM;Both;15 reps;Supine    General Comments        Pertinent Vitals/Pain Pain Assessment: No/denies pain    Home Living                      Prior Function            PT Goals (current goals can now be found in the care plan section) Progress towards PT goals: Progressing toward goals    Frequency  Min 2X/week    PT Plan Current plan remains appropriate    Co-evaluation  End of Session Equipment Utilized During Treatment: Gait belt Activity Tolerance: Patient tolerated treatment well;Patient limited by fatigue Patient left: in bed;in CPM;with bed alarm set;with family/visitor present     Time: 4098-1191 PT Time Calculation (min) (ACUTE ONLY): 30 min  Charges:  $Therapeutic Exercise: 8-22 mins $Therapeutic Activity: 8-22 mins                    G Codes:      Kristeen Miss 07/14/2015, 2:03 PM

## 2015-07-14 NOTE — Progress Notes (Signed)
PT Cancellation Note  Patient Details Name: Benjamin Kim MRN: [id number] DOB: [birth year]   Cancelled Treatment:    Reason Eval/Treat Not Completed: Patient at procedure or test/unavailable. Treatment attempted; pt not available. Re attempt later today as the schedule allows.    Elsie Stain Bishop 07/14/2015, 10:37 AM

## 2015-07-14 NOTE — Progress Notes (Signed)
Dr. Clint Guy notified of pt continuously trying to get OOB during the shift. Pt unable to redirect at times. Requested MD for a PRN sitter and/or some medicine to help calm patient. MD ordered  Haldol IVP.

## 2015-07-14 NOTE — Care Management Important Message (Signed)
Important Message  Patient Details  Name: Benjamin Kim MRN: [id number] Date of Birth: 09-Apr-1932   Medicare Important Message Given:  Yes-second notification given    Olegario Messier A Allmond 07/14/2015, 9:35 AM

## 2015-07-14 NOTE — Clinical Social Work Placement (Signed)
   CLINICAL SOCIAL WORK PLACEMENT  NOTE  Date:  07/14/2015  Patient Details  Name: ASENCION LOVEDAY MRN: [id number] Date of Birth: February 04, 1932  Clinical Social Work is seeking post-discharge placement for this patient at the Skilled  Nursing Facility level of care (*CSW will initial, date and re-position this form in  chart as items are completed):  Yes   Patient/family provided with [facility] Clinical Social Work Department's list of facilities offering this level of care within the geographic area requested by the patient (or if unable, by the patient's family).  Yes   Patient/family informed of their freedom to choose among providers that offer the needed level of care, that participate in Medicare, Medicaid or managed care program needed by the patient, have an available bed and are willing to accept the patient.  Yes   Patient/family informed of [place]'s ownership interest in [facility] and [facility], as well as of the fact that they are under no obligation to receive care at these facilities.  PASRR submitted to EDS on 07/14/15     PASRR number received on       Existing PASRR number confirmed on       FL2 transmitted to all facilities in geographic area requested by pt/family on 07/12/15     FL2 transmitted to all facilities within larger geographic area on       Patient informed that his/her managed care company has contracts with or will negotiate with certain facilities, including the following:        Yes   Patient/family informed of bed offers received.  Patient chooses bed at [facility]     Physician recommends and patient chooses bed at      Patient to be transferred to [facility] on  .  Patient to be transferred to facility by       Patient family notified on   of transfer.  Name of family member notified:        PHYSICIAN Please sign FL2     Additional Comment:    _______________________________________________ Ned Card,  LCSW 07/14/2015, 3:50 PM

## 2015-07-14 NOTE — Progress Notes (Signed)
Per Dr. Elpidio Anis place order to draw hemoglobin

## 2015-07-15 LAB — HEMOGLOBIN: Hemoglobin: 8.7 g/dL — ABNORMAL LOW (ref 13.0–18.0)

## 2015-07-15 MED ORDER — LORAZEPAM 0.5 MG PO TABS: 0.5000 mg | ORAL_TABLET | Freq: Every evening | ORAL | Status: DC | PRN

## 2015-07-15 MED ORDER — SENNOSIDES-DOCUSATE SODIUM 8.6-50 MG PO TABS: 2.0000 | ORAL_TABLET | Freq: Two times a day (BID) | ORAL | Status: DC

## 2015-07-15 MED ORDER — HALOPERIDOL LACTATE 5 MG/ML IJ SOLN
5.0000 mg | Freq: Once | INTRAMUSCULAR | Status: AC
  Administered 2015-07-15: 5 mg via INTRAVENOUS
  Filled 2015-07-15: qty 1

## 2015-07-15 MED ORDER — LORAZEPAM 0.5 MG PO TABS
0.5000 mg | ORAL_TABLET | ORAL | Status: DC
  Administered 2015-07-15 – 2015-07-16 (×3): 0.5 mg via ORAL
  Filled 2015-07-15 (×3): qty 1

## 2015-07-15 MED ORDER — METOPROLOL SUCCINATE ER 25 MG PO TB24: 25.0000 mg | ORAL_TABLET | Freq: Every day | ORAL | Status: DC

## 2015-07-15 MED ORDER — BOOST / RESOURCE BREEZE PO LIQD: 1.0000 | Freq: Two times a day (BID) | ORAL | Status: AC

## 2015-07-15 MED ORDER — INFLUENZA VAC SPLIT QUAD 0.5 ML IM SUSY
0.5000 mL | PREFILLED_SYRINGE | INTRAMUSCULAR | Status: AC
  Administered 2015-07-16: 0.5 mL via INTRAMUSCULAR
  Filled 2015-07-15: qty 0.5

## 2015-07-15 MED ORDER — PANTOPRAZOLE SODIUM 40 MG PO TBEC: 40.0000 mg | DELAYED_RELEASE_TABLET | Freq: Two times a day (BID) | ORAL | Status: AC

## 2015-07-15 MED ORDER — SUCRALFATE 1 GM/10ML PO SUSP: 1.0000 g | Freq: Three times a day (TID) | ORAL | Status: DC

## 2015-07-15 MED ORDER — METOPROLOL SUCCINATE ER 25 MG PO TB24
25.0000 mg | ORAL_TABLET | Freq: Every day | ORAL | Status: DC
  Administered 2015-07-15 – 2015-07-16 (×2): 25 mg via ORAL
  Filled 2015-07-15 (×2): qty 1

## 2015-07-15 MED ORDER — FERROUS SULFATE 325 (65 FE) MG PO TABS: 325.0000 mg | ORAL_TABLET | Freq: Two times a day (BID) | ORAL | Status: AC

## 2015-07-15 MED ORDER — POLYETHYLENE GLYCOL 3350 17 G PO PACK: 17.0000 g | PACK | Freq: Every day | ORAL | Status: AC | PRN

## 2015-07-15 NOTE — Progress Notes (Signed)
Per Dr. Elpidio Anis okay to restart home dose of lorazepam 0.5 mg 2x a day

## 2015-07-15 NOTE — Discharge Instructions (Signed)
°  DIET:  °Regular diet ° °DISCHARGE CONDITION:  °Stable ° °ACTIVITY:  °Activity as tolerated ° °OXYGEN:  °Home Oxygen: No. °  °Oxygen Delivery: room air ° °DISCHARGE LOCATION:  °nursing home  ° °If you experience worsening of your admission symptoms, develop shortness of breath, life threatening emergency, suicidal or homicidal thoughts you must seek medical attention immediately by calling 911 or calling your MD immediately  if symptoms less severe. ° °You Must read complete instructions/literature along with all the possible adverse reactions/side effects for all the Medicines you take and that have been prescribed to you. Take any new Medicines after you have completely understood and accpet all the possible adverse reactions/side effects.  ° °Please note ° °You were cared for by a hospitalist during your hospital stay. If you have any questions about your discharge medications or the care you received while you were in the hospital after you are discharged, you can call the unit and asked to speak with the hospitalist on call if the hospitalist that took care of you is not available. Once you are discharged, your primary care physician will handle any further medical issues. Please note that NO REFILLS for any discharge medications will be authorized once you are discharged, as it is imperative that you return to your primary care physician (or establish a relationship with a primary care physician if you do not have one) for your aftercare needs so that they can reassess your need for medications and monitor your lab values. ° ° ° °

## 2015-07-15 NOTE — Consult Note (Signed)
Subjective:delayed entry Patient seen for anemia, heme positive stool.   Subjective:   Patient tolerating po.no gross gi bleeding. Patient tolerated ct well.   Objective: Vital signs in last 24 hours: Temp:  [98.2 F (36.8 C)-98.5 F (36.9 C)] 98.5 F (36.9 C) (09/28 2005) Pulse Rate:  [86-102] 100 (09/28 2005) Resp:  [18] 18 (09/28 2005) BP: (107-151)/(59-72) 107/59 mmHg (09/28 2005) SpO2:  [97 %-100 %] 100 % (09/28 2005) Weight:  [58.015 kg (127 lb 14.4 oz)] 58.015 kg (127 lb 14.4 oz) (09/28 0500) Blood pressure 107/59, pulse 100, temperature 98.5 F (36.9 C), temperature source Oral, resp. rate 18, height  (1.676 m), weight 58.015 kg (127 lb 14.4 oz), SpO2 100 %.   Intake/Output from previous day: 09/27 0701 - 09/28 0700 In: 483 [P.O.:480; I.V.:3] Out: 1125 [Urine:1125]  Intake/Output this shift:     General appearance:  58 male no distress, poor orientation. cooperative Resp: bilateral clear to auscultation  Cardio:  rrr GI:  Soft nontender nondistended, bowel sounds positive/normoactive Extremities:     Lab Results: Results for orders placed or performed during the hospital encounter of 07/12/15 (from the past 24 hour(s))  Hemoglobin     Status: Abnormal   Collection Time: 07/15/15  6:43 AM  Result Value Ref Range   Hemoglobin 8.7 (L) 13.0 - 18.0 g/dL      Recent Labs  16/10/96 0531 07/14/15 1003 07/15/15 0643  WBC 6.2  --   --   HGB 7.1* 8.4* 8.7*  HCT 21.6*  --   --   PLT 314  --   --    BMET  Recent Labs  07/13/15 0531  NA 139  K 3.2*  CL 110  CO2 24  GLUCOSE 77  BUN 14  CREATININE 1.15  CALCIUM 8.7*   LFT No results for input(s): PROT, ALBUMIN, AST, ALT, ALKPHOS, BILITOT, BILIDIR, IBILI in the last 72 hours. PT/INR No results for input(s): LABPROT, INR in the last 72 hours. Hepatitis Panel No results for input(s): HEPBSAG, HCVAB, HEPAIGM, HEPBIGM in the last 72 hours. C-Diff No results for input(s): CDIFFTOX in the last 72  hours. No results for input(s): CDIFFPCR in the last 72 hours.   Studies/Results: Ct Abdomen Pelvis Wo Contrast  07/14/2015   CLINICAL DATA:  Admitted to hospital two days ago for a fall, patient with decreased oral intake and abnormal weight loss  EXAM: CT CHEST, ABDOMEN AND PELVIS WITHOUT CONTRAST  TECHNIQUE: Multidetector CT imaging of the chest, abdomen and pelvis was performed following the standard protocol without IV contrast.  COMPARISON:  07/12/15  FINDINGS: CT CHEST FINDINGS  Mediastinum/Nodes: Mild aortic calcification and coronary calcification. Large hiatal hernia. No significant hilar or mediastinal adenopathy. No pleural effusion.  Lungs/Pleura: No significant pulmonary parenchymal opacities. No pleural effusions.  Musculoskeletal: No acute findings  CT ABDOMEN PELVIS FINDINGS  Hepatobiliary: Tiny vascular calcification in the porta hepatis likely vascular. No focal inflammatory change. Given limited evaluation without contrast no evidence of biliary dilatation.  Pancreas: Normal  Spleen: Normal  Adrenals/Urinary Tract: 5 cm cystic lesion right kidney laterally not fully characterized without contrast with no complicating features on non contrasted CT. 3 mm vascular calcification left kidney inferiorly.  Stomach/Bowel: Fecal retention throughout the colon with fecal impaction in the rectum to a diameter of 7 cm. Nonobstructive bowel gas pattern. Large hiatal hernia.  Vascular/Lymphatic: Moderate atherosclerotic aortoiliac calcification  Reproductive: No significant abnormalities  Other: No free fluid  Musculoskeletal: Mild T12 compression deformity of about  33%, with an acute to subacute appearance and vacuum phenomenon in the T11-12 disc.  IMPRESSION: 1. Large hiatal hernia 2. Fecal impaction in the rectum.  Nonobstructive gas pattern. 3. 33% compression deformity T12 with an acute to subacute appearance 4. 5 cm cystic lesion right kidney not fully characterized without contrast, with no  complicating features by unenhanced CT scan.   Electronically Signed   By: Esperanza Heir M.D.   On: 07/14/2015 15:59   Ct Chest Wo Contrast  07/14/2015   CLINICAL DATA:  Admitted to hospital two days ago for a fall, patient with decreased oral intake and abnormal weight loss  EXAM: CT CHEST, ABDOMEN AND PELVIS WITHOUT CONTRAST  TECHNIQUE: Multidetector CT imaging of the chest, abdomen and pelvis was performed following the standard protocol without IV contrast.  COMPARISON:  07/12/15  FINDINGS: CT CHEST FINDINGS  Mediastinum/Nodes: Mild aortic calcification and coronary calcification. Large hiatal hernia. No significant hilar or mediastinal adenopathy. No pleural effusion.  Lungs/Pleura: No significant pulmonary parenchymal opacities. No pleural effusions.  Musculoskeletal: No acute findings  CT ABDOMEN PELVIS FINDINGS  Hepatobiliary: Tiny vascular calcification in the porta hepatis likely vascular. No focal inflammatory change. Given limited evaluation without contrast no evidence of biliary dilatation.  Pancreas: Normal  Spleen: Normal  Adrenals/Urinary Tract: 5 cm cystic lesion right kidney laterally not fully characterized without contrast with no complicating features on non contrasted CT. 3 mm vascular calcification left kidney inferiorly.  Stomach/Bowel: Fecal retention throughout the colon with fecal impaction in the rectum to a diameter of 7 cm. Nonobstructive bowel gas pattern. Large hiatal hernia.  Vascular/Lymphatic: Moderate atherosclerotic aortoiliac calcification  Reproductive: No significant abnormalities  Other: No free fluid  Musculoskeletal: Mild T12 compression deformity of about 33%, with an acute to subacute appearance and vacuum phenomenon in the T11-12 disc.  IMPRESSION: 1. Large hiatal hernia 2. Fecal impaction in the rectum.  Nonobstructive gas pattern. 3. 33% compression deformity T12 with an acute to subacute appearance 4. 5 cm cystic lesion right kidney not fully characterized  without contrast, with no complicating features by unenhanced CT scan.   Electronically Signed   By: Esperanza Heir M.D.   On: 07/14/2015 15:59   Dg Kayleen Memos  W/kub  07/14/2015   CLINICAL DATA:  Patient reports difficulty eating and not feeling well, history of anemia  EXAM: UPPER GI SERIES WITHOUT KUB  TECHNIQUE: Routine upper GI series was begun using thin barium. However, the patient was unable to tolerate more than a sip or 2 of the barium.  FLUOROSCOPY TIME:  Fluoroscopy Time (in minutes and seconds): 0 minutes, 24 seconds  Number of Acquired Images:  2  COMPARISON:  None in PACs  FINDINGS: The patient ingested only a very small amount of barium before indicating that he was getting sick an would not ingest additional barium. The patient was quite weak and positioning was limited. There is tortuosity of the distal 1/2 of the esophagus. There is a probable hiatal hernia-partially intrathoracic stomach.  IMPRESSION: Very limited study which is nondiagnostic. There is an abnormal appearance of the distal esophagus which may reflect a hiatal hernia-partially intra thoracic stomach. There is marked tortuosity of the distal 1/2 of the esophagus.  CT scanning of the chest, abdomen, and pelvis is recommended.   Electronically Signed   By: David  Swaziland M.D.   On: 07/14/2015 11:44    Scheduled Inpatient Medications:   . buPROPion  450 mg Oral Daily  . feeding supplement  1 Container Oral  BID WC  . ferrous sulfate  325 mg Oral BID WC  . [START ON 07/16/2015] Influenza vac split quadrivalent PF  0.5 mL Intramuscular Tomorrow-1000  . LORazepam  0.5 mg Oral 2 times per day  . metoprolol succinate  25 mg Oral Daily  . mirtazapine  15 mg Oral QHS  . pantoprazole  40 mg Oral BID AC  . senna-docusate  2 tablet Oral BID  . sodium chloride  3 mL Intravenous Q12H  . sucralfate  1 g Oral TID WC & HS  . venlafaxine XR  150 mg Oral q morning - 10a    Continuous Inpatient Infusions:     PRN Inpatient Medications:   acetaminophen **OR** acetaminophen, albuterol, ondansetron **OR** ondansetron (ZOFRAN) IV, polyethylene glycol  Miscellaneous:   Assessment:  1) anemia, heme positive stool.  CT negative for  Colon  Lesion.  Large hiatal hernia with large part of stomach in the thoracic cavity.  Higher risk for development of cameron erosions, a common cause of blood loss.   2) constipation-retained stool/impaction.currently on stool softener.  If no bm by tomorrow, will order tap water enemas, change miralax to daily scheduled.  Plan:  1) continue bid ppi, continue carafate at qid for 4 weeks, then decrease to maintenance dose bid.  H pylori serology.   Will sign off reconsult as needed.   Christena Deem MD 07/15/2015, 10:47 PM

## 2015-07-15 NOTE — Clinical Social Work Note (Signed)
Pasrr requested additional information to issue patient a pasrr. CSW has fowarded this information to [facility] this morning. Currently also waiting on authorization from [facility].  [name] MSW,LCSW [phone]

## 2015-07-15 NOTE — Progress Notes (Signed)
[facility] Physicians - [place] at [facility]   PATIENT NAME: Benjamin Kim    MR#:  [id number]  DATE OF BIRTH:  [birth year]  SUBJECTIVE:  CHIEF COMPLAINT:   Chief Complaint  Patient presents with  . Weakness    Per EMS pt has fallen x 3 since yesterday. States family reported that pt has had increased weakness and slurred speech.    Admitted for weakness, falls, confusion Confused at this time. Upper GI series done earlier. On clears. Wife at bedside. REVIEW OF SYSTEMS:    Review of Systems  Unable to perform ROS: dementia    DRUG ALLERGIES:   Allergies  Allergen Reactions  . Ivp Dye [Iodinated Diagnostic Agents] Anaphylaxis    VITALS:  Blood pressure 130/63, pulse 86, temperature 98.4 F (36.9 C), temperature source Oral, resp. rate 18, height  (1.676 m), weight 58.015 kg (127 lb 14.4 oz), SpO2 98 %.  PHYSICAL EXAMINATION:   Physical Exam  GENERAL:  81 y.o.-year-old patient lying in the bed with no acute distress.  EYES: Pupils equal, round, reactive to light and accommodation. No scleral icterus. Extraocular muscles intact.  HEENT: Head atraumatic, normocephalic. Oropharynx and nasopharynx clear.  NECK:  Supple, no jugular venous distention. No thyroid enlargement, no tenderness.  LUNGS: Normal breath sounds bilaterally, no wheezing, rales, rhonchi. No use of accessory muscles of respiration.  CARDIOVASCULAR: S1, S2 normal. No murmurs, rubs, or gallops.  ABDOMEN: Soft, nontender, nondistended. Bowel sounds present. No organomegaly or mass.  EXTREMITIES: No cyanosis, clubbing or edema b/l.    NEUROLOGIC: Cranial nerves II through XII are intact. No focal Motor or sensory deficits b/l.   PSYCHIATRIC: The patient is awake and pleasantly confused SKIN: No obvious rash, lesion, or ulcer.    LABORATORY PANEL:   CBC  Recent Labs Lab 07/13/15 0531  07/15/15 0643  WBC 6.2  --   --   HGB 7.1*  < > 8.7*  HCT 21.6*  --   --   PLT 314  --   --    < > = values in this interval not displayed. ------------------------------------------------------------------------------------------------------------------  Chemistries   Recent Labs Lab 07/12/15 0328 07/13/15 0531  NA 138 139  K 3.8 3.2*  CL 105 110  CO2 27 24  GLUCOSE 76 77  BUN 24* 14  CREATININE 1.66* 1.15  CALCIUM 9.7 8.7*  AST 22  --   ALT 16*  --   ALKPHOS 48  --   BILITOT 0.7  --    ------------------------------------------------------------------------------------------------------------------  Cardiac Enzymes  Recent Labs Lab 07/12/15 0851  TROPONINI <0.03   ------------------------------------------------------------------------------------------------------------------  RADIOLOGY:  Ct Abdomen Pelvis Wo Contrast  07/14/2015   CLINICAL DATA:  Admitted to hospital two days ago for a fall, patient with decreased oral intake and abnormal weight loss  EXAM: CT CHEST, ABDOMEN AND PELVIS WITHOUT CONTRAST  TECHNIQUE: Multidetector CT imaging of the chest, abdomen and pelvis was performed following the standard protocol without IV contrast.  COMPARISON:  07/12/15  FINDINGS: CT CHEST FINDINGS  Mediastinum/Nodes: Mild aortic calcification and coronary calcification. Large hiatal hernia. No significant hilar or mediastinal adenopathy. No pleural effusion.  Lungs/Pleura: No significant pulmonary parenchymal opacities. No pleural effusions.  Musculoskeletal: No acute findings  CT ABDOMEN PELVIS FINDINGS  Hepatobiliary: Tiny vascular calcification in the porta hepatis likely vascular. No focal inflammatory change. Given limited evaluation without contrast no evidence of biliary dilatation.  Pancreas: Normal  Spleen: Normal  Adrenals/Urinary Tract: 5 cm cystic lesion right kidney laterally  not fully characterized without contrast with no complicating features on non contrasted CT. 3 mm vascular calcification left kidney inferiorly.  Stomach/Bowel: Fecal retention throughout the  colon with fecal impaction in the rectum to a diameter of 7 cm. Nonobstructive bowel gas pattern. Large hiatal hernia.  Vascular/Lymphatic: Moderate atherosclerotic aortoiliac calcification  Reproductive: No significant abnormalities  Other: No free fluid  Musculoskeletal: Mild T12 compression deformity of about 33%, with an acute to subacute appearance and vacuum phenomenon in the T11-12 disc.  IMPRESSION: 1. Large hiatal hernia 2. Fecal impaction in the rectum.  Nonobstructive gas pattern. 3. 33% compression deformity T12 with an acute to subacute appearance 4. 5 cm cystic lesion right kidney not fully characterized without contrast, with no complicating features by unenhanced CT scan.   Electronically Signed   By: Esperanza Heir M.D.   On: 07/14/2015 15:59   Ct Chest Wo Contrast  07/14/2015   CLINICAL DATA:  Admitted to hospital two days ago for a fall, patient with decreased oral intake and abnormal weight loss  EXAM: CT CHEST, ABDOMEN AND PELVIS WITHOUT CONTRAST  TECHNIQUE: Multidetector CT imaging of the chest, abdomen and pelvis was performed following the standard protocol without IV contrast.  COMPARISON:  07/12/15  FINDINGS: CT CHEST FINDINGS  Mediastinum/Nodes: Mild aortic calcification and coronary calcification. Large hiatal hernia. No significant hilar or mediastinal adenopathy. No pleural effusion.  Lungs/Pleura: No significant pulmonary parenchymal opacities. No pleural effusions.  Musculoskeletal: No acute findings  CT ABDOMEN PELVIS FINDINGS  Hepatobiliary: Tiny vascular calcification in the porta hepatis likely vascular. No focal inflammatory change. Given limited evaluation without contrast no evidence of biliary dilatation.  Pancreas: Normal  Spleen: Normal  Adrenals/Urinary Tract: 5 cm cystic lesion right kidney laterally not fully characterized without contrast with no complicating features on non contrasted CT. 3 mm vascular calcification left kidney inferiorly.  Stomach/Bowel: Fecal  retention throughout the colon with fecal impaction in the rectum to a diameter of 7 cm. Nonobstructive bowel gas pattern. Large hiatal hernia.  Vascular/Lymphatic: Moderate atherosclerotic aortoiliac calcification  Reproductive: No significant abnormalities  Other: No free fluid  Musculoskeletal: Mild T12 compression deformity of about 33%, with an acute to subacute appearance and vacuum phenomenon in the T11-12 disc.  IMPRESSION: 1. Large hiatal hernia 2. Fecal impaction in the rectum.  Nonobstructive gas pattern. 3. 33% compression deformity T12 with an acute to subacute appearance 4. 5 cm cystic lesion right kidney not fully characterized without contrast, with no complicating features by unenhanced CT scan.   Electronically Signed   By: Esperanza Heir M.D.   On: 07/14/2015 15:59   Dg Kayleen Memos  W/kub  07/14/2015   CLINICAL DATA:  Patient reports difficulty eating and not feeling well, history of anemia  EXAM: UPPER GI SERIES WITHOUT KUB  TECHNIQUE: Routine upper GI series was begun using thin barium. However, the patient was unable to tolerate more than a sip or 2 of the barium.  FLUOROSCOPY TIME:  Fluoroscopy Time (in minutes and seconds): 0 minutes, 24 seconds  Number of Acquired Images:  2  COMPARISON:  None in PACs  FINDINGS: The patient ingested only a very small amount of barium before indicating that he was getting sick an would not ingest additional barium. The patient was quite weak and positioning was limited. There is tortuosity of the distal 1/2 of the esophagus. There is a probable hiatal hernia-partially intrathoracic stomach.  IMPRESSION: Very limited study which is nondiagnostic. There is an abnormal appearance of the distal esophagus  which may reflect a hiatal hernia-partially intra thoracic stomach. There is marked tortuosity of the distal 1/2 of the esophagus.  CT scanning of the chest, abdomen, and pelvis is recommended.   Electronically Signed   By: David  [place] M.D.   On: 07/14/2015 11:44      ASSESSMENT AND PLAN:   # Anemia: Likely from chronic GI losses. Upper GI series showed large hiatal hernia and tortuous esophagus. Discussed with Dr. Marva Panda.  CT abd showed no significant abnormalities contributing to anemia No plans on EGD or colonoscopy. Monitor hemoglobin. Patient is on PPI and Carafate. Start iron supplementation. We'll advance diet to a regular diet.  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 Appreciate psychiatry help in the hospital.  # Hypertension  # Recurrent falls Likely from orthostatic BP drop. Also anemia contributing. Physical therapy recommending skilled nursing facility.  # ARF over CKD3 due to dehydration Improved with IVF. Stopped  # Severe protein calorie malnutrition On supplementation.   All the records are reviewed and case discussed with Care Management/Social Workerr. Management plans discussed with the patient, family and they are in agreement.  CODE STATUS: DNR  DVT Prophylaxis: SCDs  TOTAL TIME TAKING CARE OF THIS PATIENT: 35 minutes.   POSSIBLE D/C IN 1-2 DAYS, when bed at SNF available and insurance approval done.   Milagros Loll R M.D on 07/15/2015 at 4:16 PM  Between 7am to 6pm - Pager - [phone]  After 6pm go to [email or web address] - password [email or web address]  [facility]  Office  [phone]  CC: Primary care physician; Lauro Regulus., MD    Note: This dictation was prepared with Dragon dictation along with smaller phrase technology. Any transcriptional errors that result from this process are unintentional.

## 2015-07-15 NOTE — Progress Notes (Signed)
Pt anxious and agitated, trying to get out of bed stating that he is leaving. Family at bedside. Dr. Anne Hahn notified. New orders for Haldol  IV push x1 now. Family concerned that pt is not on temazepam as he is on this at home. MD notified. Will reassess later this evening if pt is not resting well.

## 2015-07-15 NOTE — Discharge Summary (Signed)
[facility] Physicians - [place] at [facility]   PATIENT NAME: Benjamin Kim    MR#:  [id number]  DATE OF BIRTH:  [birth year]  DATE OF ADMISSION:  07/12/2015 ADMITTING PHYSICIAN: Milagros Loll, MD  DATE OF DISCHARGE: No discharge date for patient encounter.  PRIMARY CARE PHYSICIAN: Lauro Regulus., MD    ADMISSION DIAGNOSIS:  Symptomatic anemia [D64.9] Altered mental status, unspecified altered mental status type [R41.82] Gastrointestinal hemorrhage, unspecified gastritis, unspecified gastrointestinal hemorrhage type [K92.2]  DISCHARGE DIAGNOSIS:  Principal Problem:   Dementia with behavioral disturbance Active Problems:   Anemia   Gastrointestinal bleeding   Depression   Protein-calorie malnutrition, severe   SECONDARY DIAGNOSIS:   Past Medical History  Diagnosis Date  . Hypertension   . Hypercholesteremia   . Depressed   . CKD (chronic kidney disease) stage 3, GFR 30-59 ml/min   . Asthma   . Dementia   . Anxiety      ADMITTING HISTORY  Benjamin Kim is a 81 y.o. male with a known history of dementia, depression, hypertension, CKD-3, hemorrhoids presents after fall. History is provided by his wife as the patient is a poor historian. She states that he has had multiple falls since March and has had increasing confusion over the past few weeks. She states that last night she went out to walk the dog and when she came home the patient had fallen and was laying on the floor in the hallway. He was combative and confused and it was difficult to get him up to the bed. At 1:30 AM on the day of admission he got up to use the bathroom she attempted to assist him but he fell again. He was again combative and confused and would not accept her assistance. She called 911. On emergency room evaluation he has no significant traumatic injury from the fall but has been found to be anemic with a hemoglobin of 7.4. The wife denies any recent abdominal pain nausea or  vomiting. She states that he has hemorrhoids and often sees blood with bowel movements. He is frequently constipated and straining. He has had a colonoscopy in the past few years his primary gastroenterologist is Dr. Mechele Collin. His appetite has been severely decreased over the past few months due to anxiety.   HOSPITAL COURSE:   # Anemia: Likely from chronic GI losses. Upper GI series showed large hiatal hernia and tortuous esophagus. Discussed with Dr. Marva Panda.Marland Kitchen No plans on EGD or colonoscopy. Monitor hemoglobin. Patient is on PPI and Carafate. Start iron supplementation. We'll advance diet to a regular diet. CT chest, abd pelvis did not show any significant abnormalities other than a hiatal hernia, chronic thoracic vertebral fracture.  # Delirium - Due to dementia. - Improved Psychosis. Follows with psychiatry as OP. Appreciate psychiatry help in the hospital. No change in meds. F/U with his psychiatrist Dr. Marijo Conception as OP.  # Hypertension  # Recurrent falls Likely from orthostatic BP drop. Also anemia contributing. Physical therapy recommending skilled nursing facility.  # ARF over CKD3 due to dehydration Improved with IVF. Stopped  # Severe protein calorie malnutrition On supplementation.  Stable for discharge to SNF   CONSULTS OBTAINED:  Treatment Team:  Christena Deem, MD Audery Amel, MD  DRUG ALLERGIES:   Allergies  Allergen Reactions  . Ivp Dye [Iodinated Diagnostic Agents] Anaphylaxis    DISCHARGE MEDICATIONS:   Current Discharge Medication List    START taking these medications   Details  feeding supplement (BOOST / RESOURCE BREEZE)  LIQD Take 1 Container by mouth 2 (two) times daily with a meal. Refills: 0    ferrous sulfate 325 (65 FE) MG tablet Take 1 tablet (325 mg total) by mouth 2 (two) times daily with a meal. Refills: 3    metoprolol succinate (TOPROL-XL) 25 MG 24 hr tablet Take 1 tablet (25 mg total) by mouth daily.    pantoprazole  (PROTONIX) 40 MG tablet Take 1 tablet (40 mg total) by mouth 2 (two) times daily before a meal. Qty: 30 tablet, Refills: 0    polyethylene glycol (MIRALAX / GLYCOLAX) packet Take 17 g by mouth daily as needed for moderate constipation or severe constipation. Qty: 14 each, Refills: 0    senna-docusate (SENOKOT-S) 8.6-50 MG tablet Take 2 tablets by mouth 2 (two) times daily.    sucralfate (CARAFATE) 1 GM/10ML suspension Take 10 mLs (1 g total) by mouth 4 (four) times daily -  with meals and at bedtime. Qty: 420 mL, Refills: 0      CONTINUE these medications which have CHANGED   Details  LORazepam (ATIVAN) 0.5 MG tablet Take 1 tablet (0.5 mg total) by mouth at bedtime as needed for anxiety or sleep. Takes at 1pm and bedtime Qty: 15 tablet, Refills: 0      CONTINUE these medications which have NOT CHANGED   Details  buPROPion (WELLBUTRIN XL) 150 MG 24 hr tablet Take 3 tablets by mouth daily. Refills: 4    mirtazapine (REMERON) 15 MG tablet Take 1 tablet by mouth at bedtime. Refills: 11    venlafaxine XR (EFFEXOR-XR) 75 MG 24 hr capsule Take 150 mg by mouth every morning. Refills: 4      STOP taking these medications     temazepam (RESTORIL) 7.5 MG capsule          Today    VITAL SIGNS:  Blood pressure 151/59, pulse 100, temperature 98.2 F (36.8 C), temperature source Oral, resp. rate 17, height  (1.676 m), weight 58.015 kg (127 lb 14.4 oz), SpO2 100 %.  I/O:   Intake/Output Summary (Last 24 hours) at 07/15/15 1140 Last data filed at 07/15/15 1010  Gross per 24 hour  Intake    603 ml  Output   1075 ml  Net   -472 ml    PHYSICAL EXAMINATION:  Physical Exam  GENERAL:  81 y.o.-year-old patient lying in the bed with no acute distress.  LUNGS: Normal breath sounds bilaterally, no wheezing, rales,rhonchi or crepitation. No use of accessory muscles of respiration.  CARDIOVASCULAR: S1, S2 normal. No murmurs, rubs, or gallops.  ABDOMEN: Soft, non-tender,  non-distended. Bowel sounds present. No organomegaly or mass.  NEUROLOGIC: Moves all 4 extremities. PSYCHIATRIC: The patient is alert and awake. confused SKIN: No obvious rash, lesion, or ulcer.   DATA REVIEW:   CBC  Recent Labs Lab 07/13/15 0531  07/15/15 0643  WBC 6.2  --   --   HGB 7.1*  < > 8.7*  HCT 21.6*  --   --   PLT 314  --   --   < > = values in this interval not displayed.  Chemistries   Recent Labs Lab 07/12/15 0328 07/13/15 0531  NA 138 139  K 3.8 3.2*  CL 105 110  CO2 27 24  GLUCOSE 76 77  BUN 24* 14  CREATININE 1.66* 1.15  CALCIUM 9.7 8.7*  AST 22  --   ALT 16*  --   ALKPHOS 48  --   BILITOT 0.7  --  Cardiac Enzymes  Recent Labs Lab 07/12/15 0851  TROPONINI <0.03    Microbiology Results  No results found for this or any previous visit.  RADIOLOGY:  Ct Abdomen Pelvis Wo Contrast  07/14/2015   CLINICAL DATA:  Admitted to hospital two days ago for a fall, patient with decreased oral intake and abnormal weight loss  EXAM: CT CHEST, ABDOMEN AND PELVIS WITHOUT CONTRAST  TECHNIQUE: Multidetector CT imaging of the chest, abdomen and pelvis was performed following the standard protocol without IV contrast.  COMPARISON:  07/12/15  FINDINGS: CT CHEST FINDINGS  Mediastinum/Nodes: Mild aortic calcification and coronary calcification. Large hiatal hernia. No significant hilar or mediastinal adenopathy. No pleural effusion.  Lungs/Pleura: No significant pulmonary parenchymal opacities. No pleural effusions.  Musculoskeletal: No acute findings  CT ABDOMEN PELVIS FINDINGS  Hepatobiliary: Tiny vascular calcification in the porta hepatis likely vascular. No focal inflammatory change. Given limited evaluation without contrast no evidence of biliary dilatation.  Pancreas: Normal  Spleen: Normal  Adrenals/Urinary Tract: 5 cm cystic lesion right kidney laterally not fully characterized without contrast with no complicating features on non contrasted CT. 3 mm vascular  calcification left kidney inferiorly.  Stomach/Bowel: Fecal retention throughout the colon with fecal impaction in the rectum to a diameter of 7 cm. Nonobstructive bowel gas pattern. Large hiatal hernia.  Vascular/Lymphatic: Moderate atherosclerotic aortoiliac calcification  Reproductive: No significant abnormalities  Other: No free fluid  Musculoskeletal: Mild T12 compression deformity of about 33%, with an acute to subacute appearance and vacuum phenomenon in the T11-12 disc.  IMPRESSION: 1. Large hiatal hernia 2. Fecal impaction in the rectum.  Nonobstructive gas pattern. 3. 33% compression deformity T12 with an acute to subacute appearance 4. 5 cm cystic lesion right kidney not fully characterized without contrast, with no complicating features by unenhanced CT scan.   Electronically Signed   By: Esperanza Heir M.D.   On: 07/14/2015 15:59   Ct Chest Wo Contrast  07/14/2015   CLINICAL DATA:  Admitted to hospital two days ago for a fall, patient with decreased oral intake and abnormal weight loss  EXAM: CT CHEST, ABDOMEN AND PELVIS WITHOUT CONTRAST  TECHNIQUE: Multidetector CT imaging of the chest, abdomen and pelvis was performed following the standard protocol without IV contrast.  COMPARISON:  07/12/15  FINDINGS: CT CHEST FINDINGS  Mediastinum/Nodes: Mild aortic calcification and coronary calcification. Large hiatal hernia. No significant hilar or mediastinal adenopathy. No pleural effusion.  Lungs/Pleura: No significant pulmonary parenchymal opacities. No pleural effusions.  Musculoskeletal: No acute findings  CT ABDOMEN PELVIS FINDINGS  Hepatobiliary: Tiny vascular calcification in the porta hepatis likely vascular. No focal inflammatory change. Given limited evaluation without contrast no evidence of biliary dilatation.  Pancreas: Normal  Spleen: Normal  Adrenals/Urinary Tract: 5 cm cystic lesion right kidney laterally not fully characterized without contrast with no complicating features on non  contrasted CT. 3 mm vascular calcification left kidney inferiorly.  Stomach/Bowel: Fecal retention throughout the colon with fecal impaction in the rectum to a diameter of 7 cm. Nonobstructive bowel gas pattern. Large hiatal hernia.  Vascular/Lymphatic: Moderate atherosclerotic aortoiliac calcification  Reproductive: No significant abnormalities  Other: No free fluid  Musculoskeletal: Mild T12 compression deformity of about 33%, with an acute to subacute appearance and vacuum phenomenon in the T11-12 disc.  IMPRESSION: 1. Large hiatal hernia 2. Fecal impaction in the rectum.  Nonobstructive gas pattern. 3. 33% compression deformity T12 with an acute to subacute appearance 4. 5 cm cystic lesion right kidney not fully characterized without contrast,  with no complicating features by unenhanced CT scan.   Electronically Signed   By: Esperanza Heir M.D.   On: 07/14/2015 15:59   Dg Kayleen Memos  W/kub  07/14/2015   CLINICAL DATA:  Patient reports difficulty eating and not feeling well, history of anemia  EXAM: UPPER GI SERIES WITHOUT KUB  TECHNIQUE: Routine upper GI series was begun using thin barium. However, the patient was unable to tolerate more than a sip or 2 of the barium.  FLUOROSCOPY TIME:  Fluoroscopy Time (in minutes and seconds): 0 minutes, 24 seconds  Number of Acquired Images:  2  COMPARISON:  None in PACs  FINDINGS: The patient ingested only a very small amount of barium before indicating that he was getting sick an would not ingest additional barium. The patient was quite weak and positioning was limited. There is tortuosity of the distal 1/2 of the esophagus. There is a probable hiatal hernia-partially intrathoracic stomach.  IMPRESSION: Very limited study which is nondiagnostic. There is an abnormal appearance of the distal esophagus which may reflect a hiatal hernia-partially intra thoracic stomach. There is marked tortuosity of the distal 1/2 of the esophagus.  CT scanning of the chest, abdomen, and pelvis  is recommended.   Electronically Signed   By: David  [place] M.D.   On: 07/14/2015 11:44      Follow up with PCP in 1 week.  Management plans discussed with the patient, family and they are in agreement.  CODE STATUS:     Code Status Orders        Start     Ordered   07/12/15 1125  Do not attempt resuscitation (DNR)   Continuous    Question Answer Comment  In the event of cardiac or respiratory ARREST Do not call a "code blue"   In the event of cardiac or respiratory ARREST Do not perform Intubation, CPR, defibrillation or ACLS   In the event of cardiac or respiratory ARREST Use medication by any route, position, wound care, and other measures to relive pain and suffering. May use oxygen, suction and manual treatment of airway obstruction as needed for comfort.      07/12/15 1124      TOTAL TIME TAKING CARE OF THIS PATIENT ON DAY OF DISCHARGE: more than 30 minutes.    Milagros Loll R M.D on 07/15/2015 at 11:40 AM  Between 7am to 6pm - Pager - [phone]  After 6pm go to [email or web address] - password [email or web address]  [facility]  Office  [phone]  CC: Primary care physician; Lauro Regulus., MD     Note: This dictation was prepared with Dragon dictation along with smaller phrase technology. Any transcriptional errors that result from this process are unintentional.

## 2015-07-16 DIAGNOSIS — K922 Gastrointestinal hemorrhage, unspecified: Secondary | ICD-10-CM | POA: Diagnosis not present

## 2015-07-16 MED ORDER — LACTULOSE 10 GM/15ML PO SOLN: 30.0000 g | Freq: Once | ORAL | Status: DC

## 2015-07-16 NOTE — Clinical Social Work Note (Signed)
Benjamin Kim contacted CSW with auth from [facility] and stated patient could come. Paitent's family is aware and discharge summary sent. Nurse called report. Patient to transport via EMS.  [name] MSW,LCSW [phone]

## 2015-07-16 NOTE — Progress Notes (Signed)
[facility] -  at [facility]   PATIENT NAME: Benjamin Kim    MR#:  [id number]  DATE OF BIRTH:  29-Jul-1932  SUBJECTIVE:  CHIEF COMPLAINT:   Chief Complaint  Patient presents with  . Weakness    Per EMS pt has fallen x 3 since yesterday. States family reported that pt has had increased weakness and slurred speech.    Admitted for weakness, falls, confusion Confused at this time. Tolerating food  REVIEW OF SYSTEMS:    Review of Systems  Unable to perform ROS: dementia    DRUG ALLERGIES:   Allergies  Allergen Reactions  . Ivp Dye [Iodinated Diagnostic Agents] Anaphylaxis    VITALS:  Blood pressure 119/53, pulse 88, temperature 98.7 F (37.1 C), temperature source Axillary, resp. rate 18, height  (1.676 m), weight 57.607 kg (127 lb), SpO2 98 %.  PHYSICAL EXAMINATION:   Physical Exam  GENERAL:  81 y.o.-year-old patient lying in the bed with no acute distress.  EYES: Pupils equal, round, reactive to light and accommodation. No scleral icterus. Extraocular muscles intact.  HEENT: Head atraumatic, normocephalic. Oropharynx and nasopharynx clear.  NECK:  Supple, no jugular venous distention. No thyroid enlargement, no tenderness.  LUNGS: Normal breath sounds bilaterally, no wheezing, rales, rhonchi. No use of accessory muscles of respiration.  CARDIOVASCULAR: S1, S2 normal. No murmurs, rubs, or gallops.  ABDOMEN: Soft, nontender, nondistended. Bowel sounds present. No organomegaly or mass.  EXTREMITIES: No cyanosis, clubbing or edema b/l.    NEUROLOGIC: Cranial nerves II through XII are intact. No focal Motor or sensory deficits b/l.   PSYCHIATRIC: The patient is awake and pleasantly confused SKIN: No obvious rash, lesion, or ulcer.    LABORATORY PANEL:   CBC  Recent Labs Lab 07/13/15 0531  07/15/15 0643  WBC 6.2  --   --   HGB 7.1*  < > 8.7*  HCT 21.6*  --   --   PLT 314  --   --   < > = values in this interval not  displayed. ------------------------------------------------------------------------------------------------------------------  Chemistries   Recent Labs Lab 07/12/15 0328 07/13/15 0531  NA 138 139  K 3.8 3.2*  CL 105 110  CO2 27 24  GLUCOSE 76 77  BUN 24* 14  CREATININE 1.66* 1.15  CALCIUM 9.7 8.7*  AST 22  --   ALT 16*  --   ALKPHOS 48  --   BILITOT 0.7  --    ------------------------------------------------------------------------------------------------------------------  Cardiac Enzymes  Recent Labs Lab 07/12/15 0851  TROPONINI <0.03   ------------------------------------------------------------------------------------------------------------------  RADIOLOGY:  No results found.   ASSESSMENT AND PLAN:   # Anemia: Likely from chronic GI losses. Upper GI series showed large hiatal hernia and tortuous esophagus. Discussed with Dr. Marva Panda.  CT abd showed no significant abnormalities contributing to anemia No plans on EGD or colonoscopy. Monitor hemoglobin. Patient is on PPI and Carafate. Start iron supplementation.  regular diet. 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 Appreciate psychiatry help in the hospital.  # Hypertension  # Recurrent falls Likely from orthostatic BP drop. Also anemia contributing. Physical therapy recommending skilled nursing facility.  # ARF over CKD3 due to dehydration Improved with IVF. Stopped  # Severe protein calorie malnutrition On supplementation.   All the records are reviewed and case discussed with Care Management/Social Workerr. Management plans discussed with the patient, family and they are in agreement.  CODE STATUS: DNR  DVT Prophylaxis: SCDs  TOTAL TIME TAKING  CARE OF THIS PATIENT TODAY AND IN DISCHARGE ACTIVITY: 35 minutes.    Milagros Loll R M.D on 07/16/2015 at 5:54 PM  Between 7am to 6pm - Pager - [phone]  After 6pm go to [email or web address] - password [email or web address]  [email or web address]  [facility]  Office  [phone]  CC: Primary care physician; Lauro Regulus., MD    Note: This dictation was prepared with Dragon dictation along with smaller phrase technology. Any transcriptional errors that result from this process are unintentional.

## 2015-07-16 NOTE — Care Management Important Message (Signed)
Important Message  Patient Details  Name: Benjamin Kim MRN: [id number] Date of Birth: 1931-12-26   Medicare Important Message Given:  Yes-third notification given    Olegario Messier A Allmond 07/16/2015, 9:34 AM

## 2015-07-21 ENCOUNTER — Encounter: Payer: Self-pay | Admitting: Internal Medicine

## 2015-07-21 LAB — ABO/RH: ABO/RH(D): O POS

## 2015-07-21 NOTE — Progress Notes (Deleted)
Patient ID: Benjamin Kim, male   DOB: 11/15/1931, 81 y.o.   MRN: [id number]    [facility]  PCP: Lauro Regulus., MD  Code Status: DNR  Allergies  Allergen Reactions  . Ivp Dye [Iodinated Diagnostic Agents] Anaphylaxis    Chief Complaint  Patient presents with  . New Admit To SNF    New Admission      HPI:  81 y.o. patient is here for short term rehabilitation post hospital admission from   Review of Systems:  Constitutional: Negative for fever, chills, malaise/fatigue and diaphoresis.  HENT: Negative for headache, congestion, nasal discharge, hearing loss, earache, sore throat, difficulty swallowing.   Eyes: Negative for eye pain, blurred vision, double vision and discharge.  Respiratory: Negative for cough, shortness of breath and wheezing.   Cardiovascular: Negative for chest pain, palpitations, leg swelling.  Gastrointestinal: Negative for heartburn, nausea, vomiting, abdominal pain, loss of appetite, melena, diarrhea and constipation.  Genitourinary: Negative for dysuria, urgency, frequency, hematuria, incontinence and flank pain.  Musculoskeletal: Negative for back pain, falls, joint pain and myalgias. assistive device used Skin: Negative for itching, sores and rash.  Neurological: Negative for weakness,dizziness, tingling, focal weakness Psychiatric/Behavioral: Negative for depression, anxiety, insomnia and memory loss.    Past Medical History  Diagnosis Date  . Hypertension   . Hypercholesteremia   . Depressed   . CKD (chronic kidney disease) stage 3, GFR 30-59 ml/min   . Asthma   . Dementia   . Anxiety    Past Surgical History  Procedure Laterality Date  . Prostate surgery     Social History:   reports that he has never smoked. He does not have any smokeless tobacco history on file. He reports that he does not drink alcohol. His drug history is not on file.  Family History  Problem Relation Age of Onset  . Lung cancer Father       Medications:   Medication List       This list is accurate as of: 07/21/15 10:20 AM.  Always use your most recent med list.               buPROPion 150 MG 24 hr tablet  Commonly known as:  WELLBUTRIN XL  Take 3 tablets by mouth daily.     feeding supplement Liqd  Take 1 Container by mouth 2 (two) times daily with a meal.     ferrous sulfate 325 (65 FE) MG tablet  Take 1 tablet (325 mg total) by mouth 2 (two) times daily with a meal.     LORazepam 0.5 MG tablet  Commonly known as:  ATIVAN  Take 1 tablet (0.5 mg total) by mouth at bedtime as needed for anxiety or sleep. Takes at 1pm and bedtime     metoprolol succinate 25 MG 24 hr tablet  Commonly known as:  TOPROL-XL  Take 1 tablet (25 mg total) by mouth daily.     mirtazapine 15 MG tablet  Commonly known as:  REMERON  Take 1 tablet by mouth at bedtime.     pantoprazole 40 MG tablet  Commonly known as:  PROTONIX  Take 1 tablet (40 mg total) by mouth 2 (two) times daily before a meal.     polyethylene glycol packet  Commonly known as:  MIRALAX / GLYCOLAX  Take 17 g by mouth daily as needed for moderate constipation or severe constipation.     senna-docusate 8.6-50 MG tablet  Commonly known as:  Senokot-S  Take 2 tablets by mouth 2 (two) times daily.     sucralfate 1 GM/10ML suspension  Commonly known as:  CARAFATE  Take 10 mLs (1 g total) by mouth 4 (four) times daily -  with meals and at bedtime.     venlafaxine XR 75 MG 24 hr capsule  Commonly known as:  EFFEXOR-XR  Take 150 mg by mouth every morning.         Physical Exam: *** Filed Vitals:   07/21/15 1017  BP: 129/67  Pulse: 73  Temp: 98.9 F (37.2 C)  TempSrc: Oral  Resp: 20  Height:  (1.676 m)  Weight: 132 lb (59.875 kg)  SpO2: 93%    General- elderly male, well built, in no acute distress Head- normocephalic, atraumatic Ears- left ear normal tympanic membrane and normal external ear canal , right ear normal tympanic membrane  and normal external ear canal Nose- normal nasal mucosa, no maxillary or frontal sinus tenderness, no nasal discharge Throat- moist mucus membrane, normal oropharynx, dentition is  Eyes- PERRLA, EOMI, no pallor, no icterus, no discharge, normal conjunctiva, normal sclera Neck- no cervical lymphadenopathy, no supraclavicular lymphadenopathy, no thyromegaly, no jugular vein distension, no carotid bruit Chest- no chest wall deformities, no chest wall tenderness Breast- normal appearance, no masses or lumps on palpation, normal nipple and areola exam, no axillary lymphadenopathy Cardiovascular- normal s1,s2, no murmurs/ rubs/ gallops, dorsalis pedis and radial pulses, leg edema Respiratory- bilateral clear to auscultation, no wheeze, no rhonchi, no crackles, no use of accessory muscles Abdomen- bowel sounds present, soft, non tender, no organomegaly, no abdominal bruits, no guarding or rigidity, no CVA tenderness Pelvic exam- normal pelvic exam, no adenexal or cervical motion tenderness Musculoskeletal- able to move all 4 extremities, no spinal and paraspinal tenderness, normal back curvature, steady gait, no use of assistive device, normal range of motion Neurological- no focal deficit, alert and oriented to person, place and time, normal reflexes, normal muscle strength, normal sensation to fine touch and vibration Skin- warm and dry Nails- hypertrophy, ingrown Psychiatry- normal mood and affect    Labs reviewed: Basic Metabolic Panel:  Recent Labs  16/10/96 0328 07/13/15 0531  NA 138 139  K 3.8 3.2*  CL 105 110  CO2 27 24  GLUCOSE 76 77  BUN 24* 14  CREATININE 1.66* 1.15  CALCIUM 9.7 8.7*   Liver Function Tests:  Recent Labs  07/12/15 0328  AST 22  ALT 16*  ALKPHOS 48  BILITOT 0.7  PROT 6.6  ALBUMIN 3.7   No results for input(s): LIPASE, AMYLASE in the last 8760 hours.  Recent Labs  07/12/15 0851  AMMONIA 16   CBC:  Recent Labs  07/12/15 0328 07/13/15 0531  07/14/15 1003 07/15/15 0643  WBC 6.5 6.2  --   --   HGB 7.4* 7.1* 8.4* 8.7*  HCT 23.2* 21.6*  --   --   MCV 81.8 81.4  --   --   PLT 383 314  --   --    Cardiac Enzymes:  Recent Labs  07/12/15 0328 07/12/15 0851  TROPONINI <0.03 <0.03   BNP: Invalid input(s): POCBNP CBG: No results for input(s): GLUCAP in the last 8760 hours.  Radiological Exams: Ct Head Wo Contrast  07/12/2015   CLINICAL DATA:  Multiple falls since yesterday. Increased weakness and slurred speech. Altered mental status.  EXAM: CT HEAD WITHOUT CONTRAST  CT CERVICAL SPINE WITHOUT CONTRAST  TECHNIQUE: Multidetector CT imaging of the head and cervical spine was performed  following the standard protocol without intravenous contrast. Multiplanar CT image reconstructions of the cervical spine were also generated.  COMPARISON:  CT head 01/05/2015  FINDINGS: CT HEAD FINDINGS  Diffuse cerebral atrophy. Ventricular dilatation consistent with central atrophy. Low-attenuation changes throughout the deep white matter consistent with small vessel ischemia. Old lacune in the right basal ganglia. No mass effect or midline shift. No abnormal extra-axial fluid collections. Gray-white matter junctions are distinct. Basal cisterns are not effaced. No evidence of acute intracranial hemorrhage. No depressed skull fractures. Visualized paranasal sinuses and mastoid air cells are not opacified. Subcutaneous scalp lesion over the right posterior parietal lesion appears to represent a lipoma. No change since prior study.  CT CERVICAL SPINE FINDINGS  Slight anterior subluxation of C7 on T1 is likely degenerative but ligamentous injury not excluded. Otherwise normal alignment of the cervical spine. Diffuse multilevel degenerative changes with narrowed disc spaces and associated endplate hypertrophic changes. Coalition of C5-C6 and near coalition of C4 days C5 is likely degenerative. Degenerative changes throughout the cervical facet joints.  IMPRESSION:  No acute intracranial abnormalities. Chronic atrophy and small vessel ischemic changes.  Diffuse degenerative change throughout the cervical spine. Slight anterior subluxation of C7 on T1 is probably degenerative but ligamentous injury not entirely excluded. No acute displaced fractures identified.   Electronically Signed   By: Burman Nieves M.D.   On: 07/12/2015 04:27   Ct Cervical Spine Wo Contrast  07/12/2015   CLINICAL DATA:  Multiple falls since yesterday. Increased weakness and slurred speech. Altered mental status.  EXAM: CT HEAD WITHOUT CONTRAST  CT CERVICAL SPINE WITHOUT CONTRAST  TECHNIQUE: Multidetector CT imaging of the head and cervical spine was performed following the standard protocol without intravenous contrast. Multiplanar CT image reconstructions of the cervical spine were also generated.  COMPARISON:  CT head 01/05/2015  FINDINGS: CT HEAD FINDINGS  Diffuse cerebral atrophy. Ventricular dilatation consistent with central atrophy. Low-attenuation changes throughout the deep white matter consistent with small vessel ischemia. Old lacune in the right basal ganglia. No mass effect or midline shift. No abnormal extra-axial fluid collections. Gray-white matter junctions are distinct. Basal cisterns are not effaced. No evidence of acute intracranial hemorrhage. No depressed skull fractures. Visualized paranasal sinuses and mastoid air cells are not opacified. Subcutaneous scalp lesion over the right posterior parietal lesion appears to represent a lipoma. No change since prior study.  CT CERVICAL SPINE FINDINGS  Slight anterior subluxation of C7 on T1 is likely degenerative but ligamentous injury not excluded. Otherwise normal alignment of the cervical spine. Diffuse multilevel degenerative changes with narrowed disc spaces and associated endplate hypertrophic changes. Coalition of C5-C6 and near coalition of C4 days C5 is likely degenerative. Degenerative changes throughout the cervical facet  joints.  IMPRESSION: No acute intracranial abnormalities. Chronic atrophy and small vessel ischemic changes.  Diffuse degenerative change throughout the cervical spine. Slight anterior subluxation of C7 on T1 is probably degenerative but ligamentous injury not entirely excluded. No acute displaced fractures identified.   Electronically Signed   By: Burman Nieves M.D.   On: 07/12/2015 04:27   Dg Chest Portable 1 View  07/12/2015   CLINICAL DATA:  Fall, weakness, slurred speech  EXAM: PORTABLE CHEST 1 VIEW  COMPARISON:  None.  FINDINGS: Mild elevation of the left hemidiaphragm. Associated left basilar opacity, likely atelectasis, less likely pneumonia. No pleural effusion or pneumothorax.  The heart is normal in size.  IMPRESSION: Left basilar opacity, favored to reflect atelectasis, pneumonia not excluded.   Electronically Signed  By: Charline Bills M.D.   On: 07/12/2015 08:24    EKG: Independently reviewed. ***  Assessment/Plan No problem-specific assessment & plan notes found for this encounter.      Goals of care: short term rehabilitation   Labs/tests ordered:  Family/ staff Communication: reviewed care plan with patient and nursing supervisor    Oneal Grout, MD  [facility] [phone] (Monday-Friday 8 am - 5 pm) [phone] (afterhours)

## 2015-07-22 ENCOUNTER — Non-Acute Institutional Stay (SKILLED_NURSING_FACILITY): Payer: Medicare PPO | Admitting: Internal Medicine

## 2015-07-22 DIAGNOSIS — E43 Unspecified severe protein-calorie malnutrition: Secondary | ICD-10-CM

## 2015-07-22 DIAGNOSIS — I10 Essential (primary) hypertension: Secondary | ICD-10-CM | POA: Diagnosis not present

## 2015-07-22 DIAGNOSIS — R2681 Unsteadiness on feet: Secondary | ICD-10-CM

## 2015-07-22 DIAGNOSIS — D500 Iron deficiency anemia secondary to blood loss (chronic): Secondary | ICD-10-CM | POA: Diagnosis not present

## 2015-07-22 DIAGNOSIS — K922 Gastrointestinal hemorrhage, unspecified: Secondary | ICD-10-CM | POA: Diagnosis not present

## 2015-07-22 DIAGNOSIS — F0390 Unspecified dementia without behavioral disturbance: Secondary | ICD-10-CM

## 2015-07-22 DIAGNOSIS — E876 Hypokalemia: Secondary | ICD-10-CM

## 2015-07-22 DIAGNOSIS — F329 Major depressive disorder, single episode, unspecified: Secondary | ICD-10-CM

## 2015-07-22 DIAGNOSIS — K5900 Constipation, unspecified: Secondary | ICD-10-CM

## 2015-07-22 DIAGNOSIS — R531 Weakness: Secondary | ICD-10-CM | POA: Diagnosis not present

## 2015-07-22 NOTE — Progress Notes (Signed)
This encounter was created in error - please disregard.

## 2015-07-22 NOTE — Progress Notes (Signed)
Patient ID: Benjamin Kim, male   DOB: Mar 27, 1932, 81 y.o.   MRN: [id number]       [facility]  PCP: Lauro Regulus., MD  Code Status: DNR  Allergies  Allergen Reactions  . Ivp Dye [Iodinated Diagnostic Agents] Anaphylaxis    Chief Complaint  Patient presents with  . New Admit To SNF    New Admit     HPI:  81 y.o. patient is here for short term rehabilitation post hospital admission from 07/12/15- 07/16/15 post fall with confusion. He was noted to be anemic with Hb of 7.4 on admission. He was seen by GI team and underwent upper gi series showing large hiatal hernia. He was started on PPI, carafate and ferrous sulfate. His confusion was thought to be from dementia. He also had ARF and improved with iv fluids. He has past medical history of dementia, depression, hypertension, CKD-3, hemorrhoids. He is seen in his room today with his wife present. He denies any concerns this visit. He has been working well with therapy team. He had complained of some chest discomfort whit exertion and this resolved with malox per staff. No further chest pain episodes.  Review of Systems:  Constitutional: Negative for fever, chills, diaphoresis.  HENT: Negative for headache, congestion, nasal discharge, difficulty swallowing.   Eyes: Negative for eye pain, blurred vision, double vision and discharge.  Respiratory: Negative for cough, shortness of breath and wheezing.   Cardiovascular: Negative for chest pain, palpitations, leg swelling.  Gastrointestinal: Negative for heartburn, nausea, vomiting, abdominal pain. Had a bowel movement yesterday Genitourinary: Negative for dysuria, urgency, frequency, hematuria, incontinence and flank pain.  Musculoskeletal: Negative for back pain, falls in facility Skin: Negative for itching, rash.  Neurological: Negative for dizziness, tingling, focal weakness Psychiatric/Behavioral: Negative for depression    Past Medical History  Diagnosis Date    . Hypertension   . Hypercholesteremia   . Depressed   . CKD (chronic kidney disease) stage 3, GFR 30-59 ml/min   . Asthma   . Dementia   . Anxiety    Past Surgical History  Procedure Laterality Date  . Prostate surgery     Social History:   reports that he has never smoked. He does not have any smokeless tobacco history on file. He reports that he does not drink alcohol. His drug history is not on file.  Family History  Problem Relation Age of Onset  . Lung cancer Father     Medications:   Medication List       This list is accurate as of: 07/22/15 11:43 AM.  Always use your most recent med list.               buPROPion 150 MG 24 hr tablet  Commonly known as:  WELLBUTRIN XL  Take three tablets by mouth once daily for anxiety     feeding supplement Liqd  Take 1 Container by mouth 2 (two) times daily with a meal.     ferrous sulfate 325 (65 FE) MG tablet  Take 1 tablet (325 mg total) by mouth 2 (two) times daily with a meal.     LORazepam 0.5 MG tablet  Commonly known as:  ATIVAN  Take 1 tablet (0.5 mg total) by mouth at bedtime as needed for anxiety or sleep. Takes at 1pm and bedtime     metoprolol succinate 25 MG 24 hr tablet  Commonly known as:  TOPROL-XL  Take 1 tablet (25 mg total) by mouth daily.  mirtazapine 15 MG tablet  Commonly known as:  REMERON  Take 1 tablet by mouth at bedtime.     pantoprazole 40 MG tablet  Commonly known as:  PROTONIX  Take 1 tablet (40 mg total) by mouth 2 (two) times daily before a meal.     polyethylene glycol packet  Commonly known as:  MIRALAX / GLYCOLAX  Take 17 g by mouth daily as needed for moderate constipation or severe constipation.     senna-docusate 8.6-50 MG tablet  Commonly known as:  Senokot-S  Take 2 tablets by mouth 2 (two) times daily.     sucralfate 1 GM/10ML suspension  Commonly known as:  CARAFATE  Take 10 mLs (1 g total) by mouth 4 (four) times daily -  with meals and at bedtime.      venlafaxine XR 75 MG 24 hr capsule  Commonly known as:  EFFEXOR-XR  Take two tablets by mouth once daily         Physical Exam: Filed Vitals:   07/22/15 1133  BP: 129/67  Pulse: 73  Temp: 98.9 F (37.2 C)  TempSrc: Oral  Resp: 20  Height:  (1.676 m)  Weight: 132 lb (59.875 kg)  SpO2: 93%    General- elderly male, well built, in no acute distress Head- normocephalic, atraumatic Nose- normal nasal mucosa, no maxillary or frontal sinus tenderness, no nasal discharge Throat- moist mucus membrane Eyes- PERRLA, EOMI, no pallor, no icterus, no discharge, normal conjunctiva, normal sclera Neck- no cervical lymphadenopathy Cardiovascular- normal s1,s2, no murmurs, no leg edema Respiratory- bilateral clear to auscultation, no wheeze, no rhonchi, no crackles, no use of accessory muscles Abdomen- bowel sounds present, soft, non tender Musculoskeletal- able to move all 4 extremities, generalized weakness, unsteady gait, on wheelchair Neurological- no focal deficit, alert and oriented to person only Skin- warm and dry Psychiatry- normal mood and affect    Labs reviewed: Basic Metabolic Panel:  Recent Labs  16/10/96 0328 07/13/15 0531  NA 138 139  K 3.8 3.2*  CL 105 110  CO2 27 24  GLUCOSE 76 77  BUN 24* 14  CREATININE 1.66* 1.15  CALCIUM 9.7 8.7*   Liver Function Tests:  Recent Labs  07/12/15 0328  AST 22  ALT 16*  ALKPHOS 48  BILITOT 0.7  PROT 6.6  ALBUMIN 3.7   No results for input(s): LIPASE, AMYLASE in the last 8760 hours.  Recent Labs  07/12/15 0851  AMMONIA 16   CBC:  Recent Labs  07/12/15 0328 07/13/15 0531 07/14/15 1003 07/15/15 0643  WBC 6.5 6.2  --   --   HGB 7.4* 7.1* 8.4* 8.7*  HCT 23.2* 21.6*  --   --   MCV 81.8 81.4  --   --   PLT 383 314  --   --    Cardiac Enzymes:  Recent Labs  07/12/15 0328 07/12/15 0851  TROPONINI <0.03 <0.03   BNP: Invalid input(s): POCBNP CBG: No results for input(s): GLUCAP in the last 8760  hours.  Radiological Exams: Ct Head Wo Contrast  07/12/2015   CLINICAL DATA:  Multiple falls since yesterday. Increased weakness and slurred speech. Altered mental status.  EXAM: CT HEAD WITHOUT CONTRAST  CT CERVICAL SPINE WITHOUT CONTRAST  TECHNIQUE: Multidetector CT imaging of the head and cervical spine was performed following the standard protocol without intravenous contrast. Multiplanar CT image reconstructions of the cervical spine were also generated.  COMPARISON:  CT head 01/05/2015  FINDINGS: CT HEAD FINDINGS  Diffuse cerebral  atrophy. Ventricular dilatation consistent with central atrophy. Low-attenuation changes throughout the deep white matter consistent with small vessel ischemia. Old lacune in the right basal ganglia. No mass effect or midline shift. No abnormal extra-axial fluid collections. Gray-white matter junctions are distinct. Basal cisterns are not effaced. No evidence of acute intracranial hemorrhage. No depressed skull fractures. Visualized paranasal sinuses and mastoid air cells are not opacified. Subcutaneous scalp lesion over the right posterior parietal lesion appears to represent a lipoma. No change since prior study.  CT CERVICAL SPINE FINDINGS  Slight anterior subluxation of C7 on T1 is likely degenerative but ligamentous injury not excluded. Otherwise normal alignment of the cervical spine. Diffuse multilevel degenerative changes with narrowed disc spaces and associated endplate hypertrophic changes. Coalition of C5-C6 and near coalition of C4 days C5 is likely degenerative. Degenerative changes throughout the cervical facet joints.  IMPRESSION: No acute intracranial abnormalities. Chronic atrophy and small vessel ischemic changes.  Diffuse degenerative change throughout the cervical spine. Slight anterior subluxation of C7 on T1 is probably degenerative but ligamentous injury not entirely excluded. No acute displaced fractures identified.   Electronically Signed   By: Burman Nieves M.D.   On: 07/12/2015 04:27   Ct Cervical Spine Wo Contrast  07/12/2015   CLINICAL DATA:  Multiple falls since yesterday. Increased weakness and slurred speech. Altered mental status.  EXAM: CT HEAD WITHOUT CONTRAST  CT CERVICAL SPINE WITHOUT CONTRAST  TECHNIQUE: Multidetector CT imaging of the head and cervical spine was performed following the standard protocol without intravenous contrast. Multiplanar CT image reconstructions of the cervical spine were also generated.  COMPARISON:  CT head 01/05/2015  FINDINGS: CT HEAD FINDINGS  Diffuse cerebral atrophy. Ventricular dilatation consistent with central atrophy. Low-attenuation changes throughout the deep white matter consistent with small vessel ischemia. Old lacune in the right basal ganglia. No mass effect or midline shift. No abnormal extra-axial fluid collections. Gray-white matter junctions are distinct. Basal cisterns are not effaced. No evidence of acute intracranial hemorrhage. No depressed skull fractures. Visualized paranasal sinuses and mastoid air cells are not opacified. Subcutaneous scalp lesion over the right posterior parietal lesion appears to represent a lipoma. No change since prior study.  CT CERVICAL SPINE FINDINGS  Slight anterior subluxation of C7 on T1 is likely degenerative but ligamentous injury not excluded. Otherwise normal alignment of the cervical spine. Diffuse multilevel degenerative changes with narrowed disc spaces and associated endplate hypertrophic changes. Coalition of C5-C6 and near coalition of C4 days C5 is likely degenerative. Degenerative changes throughout the cervical facet joints.  IMPRESSION: No acute intracranial abnormalities. Chronic atrophy and small vessel ischemic changes.  Diffuse degenerative change throughout the cervical spine. Slight anterior subluxation of C7 on T1 is probably degenerative but ligamentous injury not entirely excluded. No acute displaced fractures identified.   Electronically  Signed   By: Burman Nieves M.D.   On: 07/12/2015 04:27   Dg Chest Portable 1 View  07/12/2015   CLINICAL DATA:  Fall, weakness, slurred speech  EXAM: PORTABLE CHEST 1 VIEW  COMPARISON:  None.  FINDINGS: Mild elevation of the left hemidiaphragm. Associated left basilar opacity, likely atelectasis, less likely pneumonia. No pleural effusion or pneumothorax.  The heart is normal in size.  IMPRESSION: Left basilar opacity, favored to reflect atelectasis, pneumonia not excluded.   Electronically Signed   By: Charline Bills M.D.   On: 07/12/2015 08:24     Assessment/Plan  Unsteady gait Leading to frequent falls along with his generalized weakness. Will have him  work with physical therapy and occupational therapy team to help with gait training and muscle strengthening exercises.fall precautions. Skin care. Encourage to be out of bed.   Generalized weakness With gi bleed, dementia and delirium. Monitor clinically. Will have patient work with PT/OT as tolerated to regain strength and restore function.  Fall precautions are in place.  Hypokalemia Monitor bmp and start kcl supplement if needed  Anemia  From chronic gi bleed. Started on ferrous sulfate 325 mg bid in hospital. Continue this and monitor h&h  Upper gi bleed Thought to be chronic. Continue carafate 1 g qid with protonix 40 mg bid and monitor  Protein calorie malnutrition Monitor his weight periodically. Continue remeron to help stimulate appetite. conbtinue feeding supplement  Major depression Stable, continue wellbutrin, venlafaxine and remeron current regimen  Constipation Continue senokot s 2 tab bid with miralax daily prn and monitor  HTN Stable bp,monitor, continue toprol xl 25 mg daily  Dementia without behavioral disturbance To provide assistance with ADLs. Pressure ulcer prophylaxis. Fall precautions.    Goals of care: short term rehabilitation   Labs/tests ordered: cbc, bmp 07/23/15  Family/ staff  Communication: reviewed care plan with patient and nursing supervisor    Oneal Grout, MD  [facility] [phone] (Monday-Friday 8 am - 5 pm) [phone] (afterhours)

## 2015-07-23 LAB — BASIC METABOLIC PANEL
BUN: 22 mg/dL — AB (ref 4–21)
CREATININE: 1.2 mg/dL (ref 0.6–1.3)
Glucose: 91 mg/dL
POTASSIUM: 4.1 mmol/L (ref 3.4–5.3)
SODIUM: 141 mmol/L (ref 137–147)

## 2015-07-23 LAB — CBC AND DIFFERENTIAL
HCT: 28 % — AB (ref 41–53)
HEMOGLOBIN: 8.2 g/dL — AB (ref 13.5–17.5)
Platelets: 335 10*3/uL (ref 150–399)
WBC: 6.4 10^3/mL

## 2015-07-27 ENCOUNTER — Other Ambulatory Visit: Payer: Self-pay | Admitting: *Deleted

## 2015-07-27 MED ORDER — LORAZEPAM 0.5 MG PO TABS
ORAL_TABLET | ORAL | Status: DC
Start: 2015-07-27 — End: 2016-05-26

## 2015-07-27 NOTE — Telephone Encounter (Signed)
[facility] 

## 2015-07-29 ENCOUNTER — Non-Acute Institutional Stay (SKILLED_NURSING_FACILITY): Payer: Medicare PPO | Admitting: Nurse Practitioner

## 2015-07-29 DIAGNOSIS — R531 Weakness: Secondary | ICD-10-CM

## 2015-07-29 DIAGNOSIS — I10 Essential (primary) hypertension: Secondary | ICD-10-CM | POA: Diagnosis not present

## 2015-07-29 DIAGNOSIS — D500 Iron deficiency anemia secondary to blood loss (chronic): Secondary | ICD-10-CM | POA: Diagnosis not present

## 2015-07-29 DIAGNOSIS — K922 Gastrointestinal hemorrhage, unspecified: Secondary | ICD-10-CM | POA: Diagnosis not present

## 2015-07-29 DIAGNOSIS — E876 Hypokalemia: Secondary | ICD-10-CM

## 2015-07-29 DIAGNOSIS — F0390 Unspecified dementia without behavioral disturbance: Secondary | ICD-10-CM | POA: Diagnosis not present

## 2015-07-29 DIAGNOSIS — K5900 Constipation, unspecified: Secondary | ICD-10-CM

## 2015-07-29 DIAGNOSIS — F329 Major depressive disorder, single episode, unspecified: Secondary | ICD-10-CM

## 2015-07-29 NOTE — Progress Notes (Signed)
Patient ID: Benjamin Kim, male   DOB: [birth year], 81 y.o.   MRN: [id number]       DNR Nursing Home Location:  [facility]   Place of Service: SNF (31)  PCP: Lauro Regulus., MD  Allergies  Allergen Reactions  . Ivp Dye [Iodinated Diagnostic Agents] Anaphylaxis    Chief Complaint  Patient presents with  . Discharge Note    Discharge    HPI:  Patient is a 81 y.o. male seen today at [facility] for discharge home. Pt has past medical history of dementia, depression, hypertension, CKD-3, hemorrhoids. Pt is here for short term rehabilitation post hospital admission from 07/12/15- 07/16/15 post fall with confusion. He was noted to be anemic with Hb of 7.4 on admission. He was seen by GI team and underwent upper GI series showing large hiatal hernia. He was started on PPI, carafate and ferrous sulfate. No bleeding noted since he has been in rehab. Feeling well, no shortness of breath, chest pains, dizziness or feeling lightheaded. Activity at baseline. Pt is a poor historian and wife in room helping with history and ROS. Patient currently doing well with therapy, now stable to discharge home with home health.   Review of Systems:  Review of Systems  Constitutional: Negative for activity change, appetite change, fatigue and unexpected weight change.  HENT: Negative for congestion and hearing loss.   Eyes: Negative.   Respiratory: Negative for cough and shortness of breath.   Cardiovascular: Negative for chest pain, palpitations and leg swelling.  Gastrointestinal: Negative for abdominal pain, diarrhea and constipation.  Genitourinary: Negative for dysuria and difficulty urinating.  Musculoskeletal: Negative for myalgias and arthralgias.  Skin: Negative for color change and wound.  Neurological: Negative for dizziness and weakness.  Psychiatric/Behavioral: Positive for confusion. Negative for behavioral problems and agitation.    Past  Medical History  Diagnosis Date  . Hypertension   . Hypercholesteremia   . Depressed   . CKD (chronic kidney disease) stage 3, GFR 30-59 ml/min   . Asthma   . Dementia   . Anxiety    Past Surgical History  Procedure Laterality Date  . Prostate surgery     Social History:   reports that he has never smoked. He does not have any smokeless tobacco history on file. He reports that he does not drink alcohol. His drug history is not on file.  Family History  Problem Relation Age of Onset  . Lung cancer Father     Medications: Patient's Medications  New Prescriptions   No medications on file  Previous Medications   BUPROPION (WELLBUTRIN XL) 150 MG 24 HR TABLET    Take three tablets by mouth once daily for anxiety   FEEDING SUPPLEMENT (BOOST / RESOURCE BREEZE) LIQD    Take 1 Container by mouth 2 (two) times daily with a meal.   FERROUS SULFATE 325 (65 FE) MG TABLET    Take 1 tablet (325 mg total) by mouth 2 (two) times daily with a meal.   LORAZEPAM (ATIVAN) 0.5 MG TABLET    Take one tablet by mouth twice daily at 1pm and every night at bedtime for anxiety   METOPROLOL SUCCINATE (TOPROL-XL) 25 MG 24 HR TABLET    Take 1 tablet (25 mg total) by mouth daily.   MIRTAZAPINE (REMERON) 15 MG TABLET    Take 1 tablet by mouth at bedtime.   PANTOPRAZOLE (PROTONIX) 40 MG TABLET    Take 1 tablet (40  mg total) by mouth 2 (two) times daily before a meal.   POLYETHYLENE GLYCOL (MIRALAX / GLYCOLAX) PACKET    Take 17 g by mouth daily as needed for moderate constipation or severe constipation.   SENNA-DOCUSATE (SENOKOT-S) 8.6-50 MG TABLET    Take 2 tablets by mouth 2 (two) times daily.   SUCRALFATE (CARAFATE) 1 GM/10ML SUSPENSION    Take 10 mLs (1 g total) by mouth 4 (four) times daily -  with meals and at bedtime.   VENLAFAXINE XR (EFFEXOR-XR) 75 MG 24 HR CAPSULE    Take two tablets by mouth once daily  Modified Medications   No medications on file  Discontinued Medications   No medications on file       Physical Exam: Filed Vitals:   07/29/15 1314  BP: 122/62  Pulse: 85  Temp: 98.3 F (36.8 C)  TempSrc: Oral  Resp: 20  Height:  (1.676 m)  Weight: 132 lb (59.875 kg)    Physical Exam  Constitutional: He is oriented to person, place, and time. He appears well-developed and well-nourished. No distress.  HENT:  Head: Normocephalic and atraumatic.  Mouth/Throat: Oropharynx is clear and moist. No oropharyngeal exudate.  Eyes: Conjunctivae and EOM are normal. Pupils are equal, round, and reactive to light.  Neck: Normal range of motion. Neck supple.  Cardiovascular: Normal rate, regular rhythm and normal heart sounds.   Pulmonary/Chest: Effort normal and breath sounds normal.  Abdominal: Soft. Bowel sounds are normal.  Musculoskeletal: He exhibits no edema or tenderness.  Neurological: He is alert and oriented to person, place, and time.  Skin: Skin is warm and dry. He is not diaphoretic.  Psychiatric: He has a normal mood and affect.  Memory loss     Labs reviewed: Basic Metabolic Panel:  Recent Labs  16/10/96 0328 07/13/15 0531 07/23/15  NA 138 139 141  K 3.8 3.2* 4.1  CL 105 110  --   CO2 27 24  --   GLUCOSE 76 77  --   BUN 24* 14 22*  CREATININE 1.66* 1.15 1.2  CALCIUM 9.7 8.7*  --    Liver Function Tests:  Recent Labs  07/12/15 0328  AST 22  ALT 16*  ALKPHOS 48  BILITOT 0.7  PROT 6.6  ALBUMIN 3.7   No results for input(s): LIPASE, AMYLASE in the last 8760 hours.  Recent Labs  07/12/15 0851  AMMONIA 16   CBC:  Recent Labs  07/12/15 0328 07/13/15 0531 07/14/15 1003 07/15/15 0643 07/23/15  WBC 6.5 6.2  --   --  6.4  HGB 7.4* 7.1* 8.4* 8.7* 8.2*  HCT 23.2* 21.6*  --   --  28*  MCV 81.8 81.4  --   --   --   PLT 383 314  --   --  335   TSH: No results for input(s): TSH in the last 8760 hours. A1C: No results found for: HGBA1C Lipid Panel: No results for input(s): CHOL, HDL, LDLCALC, TRIG, CHOLHDL, LDLDIRECT in the last 8760  hours.  Radiological Exams: Ct Head Wo Contrast  07/12/2015  CLINICAL DATA:  Multiple falls since yesterday. Increased weakness and slurred speech. Altered mental status. EXAM: CT HEAD WITHOUT CONTRAST CT CERVICAL SPINE WITHOUT CONTRAST TECHNIQUE: Multidetector CT imaging of the head and cervical spine was performed following the standard protocol without intravenous contrast. Multiplanar CT image reconstructions of the cervical spine were also generated. COMPARISON:  CT head 01/05/2015 FINDINGS: CT HEAD FINDINGS Diffuse cerebral atrophy. Ventricular dilatation consistent  with central atrophy. Low-attenuation changes throughout the deep white matter consistent with small vessel ischemia. Old lacune in the right basal ganglia. No mass effect or midline shift. No abnormal extra-axial fluid collections. Gray-white matter junctions are distinct. Basal cisterns are not effaced. No evidence of acute intracranial hemorrhage. No depressed skull fractures. Visualized paranasal sinuses and mastoid air cells are not opacified. Subcutaneous scalp lesion over the right posterior parietal lesion appears to represent a lipoma. No change since prior study. CT CERVICAL SPINE FINDINGS Slight anterior subluxation of C7 on T1 is likely degenerative but ligamentous injury not excluded. Otherwise normal alignment of the cervical spine. Diffuse multilevel degenerative changes with narrowed disc spaces and associated endplate hypertrophic changes. Coalition of C5-C6 and near coalition of C4 days C5 is likely degenerative. Degenerative changes throughout the cervical facet joints. IMPRESSION: No acute intracranial abnormalities. Chronic atrophy and small vessel ischemic changes. Diffuse degenerative change throughout the cervical spine. Slight anterior subluxation of C7 on T1 is probably degenerative but ligamentous injury not entirely excluded. No acute displaced fractures identified. Electronically Signed   By: Burman NievesWilliam  Stevens M.D.    On: 07/12/2015 04:27   Ct Cervical Spine Wo Contrast  07/12/2015  CLINICAL DATA:  Multiple falls since yesterday. Increased weakness and slurred speech. Altered mental status. EXAM: CT HEAD WITHOUT CONTRAST CT CERVICAL SPINE WITHOUT CONTRAST TECHNIQUE: Multidetector CT imaging of the head and cervical spine was performed following the standard protocol without intravenous contrast. Multiplanar CT image reconstructions of the cervical spine were also generated. COMPARISON:  CT head 01/05/2015 FINDINGS: CT HEAD FINDINGS Diffuse cerebral atrophy. Ventricular dilatation consistent with central atrophy. Low-attenuation changes throughout the deep white matter consistent with small vessel ischemia. Old lacune in the right basal ganglia. No mass effect or midline shift. No abnormal extra-axial fluid collections. Gray-white matter junctions are distinct. Basal cisterns are not effaced. No evidence of acute intracranial hemorrhage. No depressed skull fractures. Visualized paranasal sinuses and mastoid air cells are not opacified. Subcutaneous scalp lesion over the right posterior parietal lesion appears to represent a lipoma. No change since prior study. CT CERVICAL SPINE FINDINGS Slight anterior subluxation of C7 on T1 is likely degenerative but ligamentous injury not excluded. Otherwise normal alignment of the cervical spine. Diffuse multilevel degenerative changes with narrowed disc spaces and associated endplate hypertrophic changes. Coalition of C5-C6 and near coalition of C4 days C5 is likely degenerative. Degenerative changes throughout the cervical facet joints. IMPRESSION: No acute intracranial abnormalities. Chronic atrophy and small vessel ischemic changes. Diffuse degenerative change throughout the cervical spine. Slight anterior subluxation of C7 on T1 is probably degenerative but ligamentous injury not entirely excluded. No acute displaced fractures identified. Electronically Signed   By: Burman NievesWilliam  Stevens  M.D.   On: 07/12/2015 04:27   Dg Chest Portable 1 View  07/12/2015  CLINICAL DATA:  Fall, weakness, slurred speech EXAM: PORTABLE CHEST 1 VIEW COMPARISON:  None. FINDINGS: Mild elevation of the left hemidiaphragm. Associated left basilar opacity, likely atelectasis, less likely pneumonia. No pleural effusion or pneumothorax. The heart is normal in size. IMPRESSION: Left basilar opacity, favored to reflect atelectasis, pneumonia not excluded. Electronically Signed   By: Charline BillsSriyesh  Krishnan M.D.   On: 07/12/2015 08:24   Assessment/Plan  1. Gastrointestinal hemorrhage, unspecified gastritis, unspecified gastrointestinal hemorrhage type Chronic, conts on Carafate 1 gm QID with protonix 40 mg BID    2.  Dementia without behavioral disturbance Stable, no behaviors or acute cognitive changes, will be going home with wife who helps with ADLs  3. Iron deficiency anemia due to chronic blood loss From chronic gi bleed. Started on ferrous sulfate 325 mg bid in hospital  hgb 8.2 on last labs, will recheck STAT CBC in am to follow up prior to discharge, will need ongoing outpatient follow up   4. Major depression, chronic (HCC) Stable, conts on Wellbutrin, venlafaxine and Remeron has    5. Hypokalemia Stable, potassium 4.1 on recent labs, not on supplement   6. Essential hypertension, benign Stable on Toprol XL  7. Constipation, unspecified constipation type Stable, conts on senokot s BID and miralax as needed  8. Generalized weakness Has improved but still with weakness. pt is stable for discharge-will need PT/OT/HHA per home health. DME FWW. Rx written.  will need to follow up with PCP within 2 weeks.   Janene Harvey. Biagio Borg  [facility] [phone] 8 am - 5 pm) [phone] (after hours)

## 2015-09-18 ENCOUNTER — Other Ambulatory Visit: Payer: Self-pay | Admitting: Internal Medicine

## 2016-05-23 ENCOUNTER — Emergency Department: Payer: Medicare Other

## 2016-05-23 ENCOUNTER — Inpatient Hospital Stay
Admission: EM | Admit: 2016-05-23 | Discharge: 2016-05-26 | DRG: 101 | Disposition: A | Discharge status: Other Acute Inpatient Hospital | Payer: Medicare Other | Attending: Internal Medicine | Admitting: Internal Medicine

## 2016-05-23 ENCOUNTER — Encounter: Payer: Self-pay | Admitting: Emergency Medicine

## 2016-05-23 DIAGNOSIS — F419 Anxiety disorder, unspecified: Secondary | ICD-10-CM | POA: Diagnosis present

## 2016-05-23 DIAGNOSIS — R569 Unspecified convulsions: Principal | ICD-10-CM | POA: Diagnosis present

## 2016-05-23 DIAGNOSIS — N179 Acute kidney failure, unspecified: Secondary | ICD-10-CM | POA: Diagnosis present

## 2016-05-23 DIAGNOSIS — R55 Syncope and collapse: Secondary | ICD-10-CM | POA: Diagnosis not present

## 2016-05-23 DIAGNOSIS — F329 Major depressive disorder, single episode, unspecified: Secondary | ICD-10-CM | POA: Diagnosis present

## 2016-05-23 DIAGNOSIS — R062 Wheezing: Secondary | ICD-10-CM

## 2016-05-23 DIAGNOSIS — E785 Hyperlipidemia, unspecified: Secondary | ICD-10-CM | POA: Diagnosis present

## 2016-05-23 DIAGNOSIS — J45909 Unspecified asthma, uncomplicated: Secondary | ICD-10-CM | POA: Diagnosis present

## 2016-05-23 DIAGNOSIS — D649 Anemia, unspecified: Secondary | ICD-10-CM | POA: Diagnosis present

## 2016-05-23 DIAGNOSIS — F0390 Unspecified dementia without behavioral disturbance: Secondary | ICD-10-CM | POA: Diagnosis present

## 2016-05-23 DIAGNOSIS — E876 Hypokalemia: Secondary | ICD-10-CM | POA: Diagnosis present

## 2016-05-23 DIAGNOSIS — R740 Nonspecific elevation of levels of transaminase and lactic acid dehydrogenase [LDH]: Secondary | ICD-10-CM | POA: Diagnosis present

## 2016-05-23 DIAGNOSIS — Z91041 Radiographic dye allergy status: Secondary | ICD-10-CM

## 2016-05-23 DIAGNOSIS — K219 Gastro-esophageal reflux disease without esophagitis: Secondary | ICD-10-CM | POA: Diagnosis present

## 2016-05-23 DIAGNOSIS — R509 Fever, unspecified: Secondary | ICD-10-CM | POA: Diagnosis present

## 2016-05-23 DIAGNOSIS — R7989 Other specified abnormal findings of blood chemistry: Secondary | ICD-10-CM

## 2016-05-23 DIAGNOSIS — Z801 Family history of malignant neoplasm of trachea, bronchus and lung: Secondary | ICD-10-CM

## 2016-05-23 DIAGNOSIS — I129 Hypertensive chronic kidney disease with stage 1 through stage 4 chronic kidney disease, or unspecified chronic kidney disease: Secondary | ICD-10-CM | POA: Diagnosis present

## 2016-05-23 DIAGNOSIS — E7800 Pure hypercholesterolemia, unspecified: Secondary | ICD-10-CM | POA: Diagnosis present

## 2016-05-23 DIAGNOSIS — N183 Chronic kidney disease, stage 3 (moderate): Secondary | ICD-10-CM | POA: Diagnosis present

## 2016-05-23 LAB — URINALYSIS COMPLETE WITH MICROSCOPIC (ARMC ONLY)
BACTERIA UA: NONE SEEN
Bilirubin Urine: NEGATIVE
Glucose, UA: NEGATIVE mg/dL
HGB URINE DIPSTICK: NEGATIVE
Ketones, ur: NEGATIVE mg/dL
Leukocytes, UA: NEGATIVE
NITRITE: NEGATIVE
PROTEIN: NEGATIVE mg/dL
SPECIFIC GRAVITY, URINE: 1.012 (ref 1.005–1.030)
SQUAMOUS EPITHELIAL / LPF: NONE SEEN
pH: 6 (ref 5.0–8.0)

## 2016-05-23 LAB — COMPREHENSIVE METABOLIC PANEL
ALT: 19 U/L (ref 17–63)
AST: 36 U/L (ref 15–41)
Albumin: 3.7 g/dL (ref 3.5–5.0)
Alkaline Phosphatase: 71 U/L (ref 38–126)
Anion gap: 6 (ref 5–15)
BILIRUBIN TOTAL: 0.4 mg/dL (ref 0.3–1.2)
BUN: 24 mg/dL — AB (ref 6–20)
CO2: 26 mmol/L (ref 22–32)
Calcium: 9.8 mg/dL (ref 8.9–10.3)
Chloride: 106 mmol/L (ref 101–111)
Creatinine, Ser: 1.53 mg/dL — ABNORMAL HIGH (ref 0.61–1.24)
GFR, EST AFRICAN AMERICAN: 47 mL/min — AB (ref >60)
GFR, EST NON AFRICAN AMERICAN: 41 mL/min — AB (ref >60)
Glucose, Bld: 115 mg/dL — ABNORMAL HIGH (ref 65–99)
POTASSIUM: 4.3 mmol/L (ref 3.5–5.1)
Sodium: 138 mmol/L (ref 135–145)
TOTAL PROTEIN: 6.6 g/dL (ref 6.5–8.1)

## 2016-05-23 LAB — CBC WITH DIFFERENTIAL/PLATELET
BASOS ABS: 0.1 10*3/uL (ref 0–0.1)
Basophils Relative: 1 %
Eosinophils Absolute: 0.2 10*3/uL (ref 0–0.7)
Eosinophils Relative: 3 %
HEMATOCRIT: 37.3 % — AB (ref 40.0–52.0)
Hemoglobin: 12.3 g/dL — ABNORMAL LOW (ref 13.0–18.0)
Lymphocytes Relative: 9 %
Lymphs Abs: 0.7 10*3/uL — ABNORMAL LOW (ref 1.0–3.6)
MCH: 29.1 pg (ref 26.0–34.0)
MCHC: 33 g/dL (ref 32.0–36.0)
MCV: 88.2 fL (ref 80.0–100.0)
MONOS PCT: 11 %
Monocytes Absolute: 0.9 10*3/uL (ref 0.2–1.0)
NEUTROS ABS: 6.3 10*3/uL (ref 1.4–6.5)
NEUTROS PCT: 76 %
Platelets: 280 10*3/uL (ref 150–440)
RBC: 4.23 MIL/uL — ABNORMAL LOW (ref 4.40–5.90)
RDW: 13.3 % (ref 11.5–14.5)
WBC: 8.1 10*3/uL (ref 3.8–10.6)

## 2016-05-23 LAB — TROPONIN I

## 2016-05-23 LAB — LACTIC ACID, PLASMA: LACTIC ACID, VENOUS: 3.6 mmol/L — AB (ref 0.5–1.9)

## 2016-05-23 LAB — LIPASE, BLOOD: Lipase: 24 U/L (ref 11–51)

## 2016-05-23 MED ORDER — SODIUM CHLORIDE 0.9 % IV BOLUS (SEPSIS)
500.0000 mL | Freq: Once | INTRAVENOUS | Status: AC
  Administered 2016-05-23: 500 mL via INTRAVENOUS

## 2016-05-23 NOTE — ED Provider Notes (Signed)
[facility] Emergency Department Provider Note   ____________________________________________   First MD Initiated Contact with Patient 05/23/16 2000     (approximate)  I have reviewed the triage vital signs and the nursing notes.   HISTORY  Chief Complaint Loss of Consciousness  EM caveat: Limited due to patient's severe dementia  HPI Benjamin Kim is a 82 y.o. male presents after an episode of passing out.  The patient's son witnessed the patient sitting on the bed, he was upset and then he laid onto his side and had a brief shaking episode and was passed out for about 1 minute. On arrival of EMS the patient was stable, with stable mental status.  The patient does not complain of anything. He does report that he is scared, does not want anyone to hurt him here.   Past Medical History:  Diagnosis Date  . Anxiety   . Asthma   . CKD (chronic kidney disease) stage 3, GFR 30-59 ml/min   . Dementia   . Depressed   . Hypercholesteremia   . Hypertension     Patient Active Problem List   Diagnosis Date Noted  . Protein-calorie malnutrition, severe (HCC) 07/14/2015  . Dementia with behavioral disturbance 07/13/2015  . Depression 07/13/2015  . Anemia 07/12/2015  . Gastrointestinal bleeding 07/12/2015    Past Surgical History:  Procedure Laterality Date  . PROSTATE SURGERY      Prior to Admission medications   Medication Sig Start Date End Date Taking? Authorizing Provider  buPROPion (WELLBUTRIN XL) 150 MG 24 hr tablet Take three tablets by mouth once daily for anxiety 07/07/15   Historical Provider, MD  feeding supplement (BOOST / RESOURCE BREEZE) LIQD Take 1 Container by mouth 2 (two) times daily with a meal. 07/15/15   Milagros LollSrikar Sudini, MD  ferrous sulfate 325 (65 FE) MG tablet Take 1 tablet (325 mg total) by mouth 2 (two) times daily with a meal. 07/15/15   Milagros LollSrikar Sudini, MD  LORazepam (ATIVAN) 0.5 MG tablet Take one tablet by mouth twice  daily at 1pm and every night at bedtime for anxiety 07/27/15   Tiffany L Reed, DO  metoprolol succinate (TOPROL-XL) 25 MG 24 hr tablet Take 1 tablet (25 mg total) by mouth daily. Patient taking differently: Take one tablet by mouth once daily for hypertension 07/15/15   Milagros LollSrikar Sudini, MD  mirtazapine (REMERON) 15 MG tablet Take 1 tablet by mouth at bedtime. 06/12/15   Historical Provider, MD  pantoprazole (PROTONIX) 40 MG tablet Take 1 tablet (40 mg total) by mouth 2 (two) times daily before a meal. 07/15/15   Milagros LollSrikar Sudini, MD  polyethylene glycol (MIRALAX / GLYCOLAX) packet Take 17 g by mouth daily as needed for moderate constipation or severe constipation. 07/15/15   Srikar Sudini, MD  senna-docusate (SENOKOT-S) 8.6-50 MG tablet Take 2 tablets by mouth 2 (two) times daily. 07/15/15   Srikar Sudini, MD  sucralfate (CARAFATE) 1 GM/10ML suspension Take 10 mLs (1 g total) by mouth 4 (four) times daily -  with meals and at bedtime. 07/15/15   Milagros LollSrikar Sudini, MD  venlafaxine XR (EFFEXOR-XR) 75 MG 24 hr capsule Take two tablets by mouth once daily 06/16/15   Historical Provider, MD    Allergies Ivp dye [iodinated diagnostic agents]  Family History  Problem Relation Age of Onset  . Lung cancer Father     Social History Social History  Substance Use Topics  . Smoking status: Never Smoker  . Smokeless tobacco: Never Used  .  Alcohol use No    Review of Systems  EM caveat: Family reports the patient did have an episode where he tripped earlier today, facility called and notified his wife, but they also notified that there was no evidence of injury. He is otherwise been in normal health, but recently moved to memory care about one week ago and he has been upset about having to the living there.   ____________________________________________   PHYSICAL EXAM:  VITAL SIGNS: ED Triage Vitals [05/23/16 2005]  Enc Vitals Group     BP      Pulse Rate (!) 103     Resp      Temp      Temp Source  Oral     SpO2 95 %     Weight 132 lb (59.9 kg)     Height 5\' 10"  (1.778 m)     Head Circumference      Peak Flow      Pain Score      Pain Loc      Pain Edu?      Excl. in GC?     Constitutional: Alert and orientedTo self but not to place or date Eyes: Conjunctivae are normal. PERRL. EOMI. Head: Atraumatic. Nose: No congestion/rhinnorhea. Mouth/Throat: Mucous membranes are moist.  Oropharynx non-erythematous. Neck: No stridor.  No cervical tenderness Cardiovascular: Normal rate, regular rhythm. Grossly normal heart sounds. Respiratory: Normal respiratory effort.  No retractions. Lungs CTAB. Gastrointestinal: Soft and nontender. No distention.  Musculoskeletal: No lower extremity tenderness nor edema.  Moves all extremities well to command. Neurologic:  Normal speech and language. No gross focal neurologic deficits are appreciated.  Skin:  Skin is warm, dry and intact. No rash noted. Psychiatric: Mood and affect are normal. Speech and behavior are normal.  ____________________________________________   LABS (all labs ordered are listed, but only abnormal results are displayed)  Labs Reviewed  CBC WITH DIFFERENTIAL/PLATELET - Abnormal; Notable for the following:       Result Value   RBC 4.23 (*)    Hemoglobin 12.3 (*)    HCT 37.3 (*)    Lymphs Abs 0.7 (*)    All other components within normal limits  COMPREHENSIVE METABOLIC PANEL - Abnormal; Notable for the following:    Glucose, Bld 115 (*)    BUN 24 (*)    Creatinine, Ser 1.53 (*)    GFR calc non Af Amer 41 (*)    GFR calc Af Amer 47 (*)    All other components within normal limits  LACTIC ACID, PLASMA - Abnormal; Notable for the following:    Lactic Acid, Venous 3.6 (*)    All other components within normal limits  TROPONIN I  LIPASE, BLOOD  URINALYSIS COMPLETEWITH MICROSCOPIC ([facility] ONLY)  LACTIC ACID, PLASMA   ____________________________________________  EKG  Reviewed and to revive me at 2010 Heart rate  100 Fear 150 QTc 450 Reviewed and interpreted as sinus tachycardia, no evidence of acute ischemia noted. ____________________________________________  RADIOLOGY  Ct Head Wo Contrast  Result Date: 05/23/2016 CLINICAL DATA:  Witnessed syncopal episode while sitting on bed. History of dementia, hypertension and hypercholesterolemia. EXAM: CT HEAD WITHOUT CONTRAST TECHNIQUE: Contiguous axial images were obtained from the base of the skull through the vertex without intravenous contrast. COMPARISON:  CT HEAD July 12, 2015 FINDINGS: INTRACRANIAL CONTENTS: The ventricles and sulci are normal for age. No intraparenchymal hemorrhage, mass effect nor midline shift. Patchy to confluent supratentorial white matter hypodensities are similar. RIGHT inferior basal ganglia  perivascular space. Old LEFT basal ganglia lacunar infarct. No acute large vascular territory infarcts. No abnormal extra-axial fluid collections. Basal cisterns are patent. Mild calcific atherosclerosis of the carotid siphons. ORBITS: The included ocular globes and orbital contents are non-suspicious. Status post bilateral ocular lens implants. SINUSES: The mastoid aircells and included paranasal sinuses are well-aerated. SKULL/SOFT TISSUES: No skull fracture. No significant soft tissue swelling. RIGHT occipital scalp lipoma. IMPRESSION: No acute intracranial process. Stable involutional changes and moderate to severe chronic small vessel ischemic disease. Old LEFT basal ganglia lacunar infarct. Electronically Signed   By: Awilda Metro M.D.   On: 05/23/2016 22:26   Dg Chest Port 1 View  Result Date: 05/23/2016 CLINICAL DATA:  Patient lives in a nursing facility. Witnessed syncopal episode while sitting on a bed. EXAM: PORTABLE CHEST 1 VIEW COMPARISON:  03/06/2015, 07/14/2015 FINDINGS: Heart size is upper normal. Large hiatal hernia is present. There are no focal consolidations or pleural effusions. No pulmonary edema. IMPRESSION: No  evidence for acute  abnormality.  Hiatal hernia. Electronically Signed   By: Norva Pavlov M.D.   On: 05/23/2016 20:58    ____________________________________________   PROCEDURES  Procedure(s) performed: None  Procedures  Critical Care performed: No  ____________________________________________   INITIAL IMPRESSION / ASSESSMENT AND PLAN / ED COURSE  Pertinent labs & imaging results that were available during my care of the patient were reviewed by me and considered in my medical decision making (see chart for details).  Patient presents after witnessed syncopal episode with his son. Patient evidently was agitated and upset about his current living condition at memory care, he then had a questionable episode where he may have had some seizure-like shaking that went away after 1 minute. Son does report that he seemed passed out for about 1 minute. Wife reports in May of last year he had a somewhat similar episode. He has no history of seizures per se.  Around the ER the patient is awake alert and stable. His family reports he is acting normally which is that of some waxing and waning mental status with confusion at baseline due to dementia. He has no obvious injury or neurologic deficit. CT dad reassuring.  Patient's labs and EKG are also reassuring, however in light of the patient's age I did discuss with the family and we will admit him for observation for syncope. Urinalysis is pending. Discussed with the hospitalist who is agreeable.  Clinical Course     ____________________________________________   FINAL CLINICAL IMPRESSION(S) / ED DIAGNOSES  Final diagnoses:  Syncope  Syncope and collapse  Elevated lactic acid level      NEW MEDICATIONS STARTED DURING THIS VISIT:  New Prescriptions   No medications on file     Note:  This document was prepared using Dragon voice recognition software and may include unintentional dictation errors.     Sharyn Creamer,  MD 05/23/16 2239

## 2016-05-23 NOTE — ED Triage Notes (Signed)
[facility] witnessed syncope episode. EMS reports pts son witnessed pt have a syncopal episode while sitting on a bed. EMS reports pt was alert to baseline in route. Pt has Hx of dementia.

## 2016-05-24 ENCOUNTER — Inpatient Hospital Stay (HOSPITAL_COMMUNITY): Payer: Medicare Other

## 2016-05-24 ENCOUNTER — Inpatient Hospital Stay: Payer: Medicare Other

## 2016-05-24 DIAGNOSIS — N183 Chronic kidney disease, stage 3 (moderate): Secondary | ICD-10-CM | POA: Diagnosis present

## 2016-05-24 DIAGNOSIS — R740 Nonspecific elevation of levels of transaminase and lactic acid dehydrogenase [LDH]: Secondary | ICD-10-CM | POA: Diagnosis present

## 2016-05-24 DIAGNOSIS — E7800 Pure hypercholesterolemia, unspecified: Secondary | ICD-10-CM | POA: Diagnosis present

## 2016-05-24 DIAGNOSIS — R569 Unspecified convulsions: Secondary | ICD-10-CM | POA: Diagnosis present

## 2016-05-24 DIAGNOSIS — D649 Anemia, unspecified: Secondary | ICD-10-CM | POA: Diagnosis present

## 2016-05-24 DIAGNOSIS — R509 Fever, unspecified: Secondary | ICD-10-CM | POA: Diagnosis present

## 2016-05-24 DIAGNOSIS — E876 Hypokalemia: Secondary | ICD-10-CM | POA: Diagnosis present

## 2016-05-24 DIAGNOSIS — R55 Syncope and collapse: Secondary | ICD-10-CM | POA: Diagnosis present

## 2016-05-24 DIAGNOSIS — N179 Acute kidney failure, unspecified: Secondary | ICD-10-CM | POA: Diagnosis present

## 2016-05-24 DIAGNOSIS — F419 Anxiety disorder, unspecified: Secondary | ICD-10-CM | POA: Diagnosis present

## 2016-05-24 DIAGNOSIS — K219 Gastro-esophageal reflux disease without esophagitis: Secondary | ICD-10-CM | POA: Diagnosis present

## 2016-05-24 DIAGNOSIS — Z91041 Radiographic dye allergy status: Secondary | ICD-10-CM | POA: Diagnosis not present

## 2016-05-24 DIAGNOSIS — E785 Hyperlipidemia, unspecified: Secondary | ICD-10-CM | POA: Diagnosis present

## 2016-05-24 DIAGNOSIS — F329 Major depressive disorder, single episode, unspecified: Secondary | ICD-10-CM | POA: Diagnosis present

## 2016-05-24 DIAGNOSIS — F0390 Unspecified dementia without behavioral disturbance: Secondary | ICD-10-CM | POA: Diagnosis present

## 2016-05-24 DIAGNOSIS — J45909 Unspecified asthma, uncomplicated: Secondary | ICD-10-CM | POA: Diagnosis present

## 2016-05-24 DIAGNOSIS — I129 Hypertensive chronic kidney disease with stage 1 through stage 4 chronic kidney disease, or unspecified chronic kidney disease: Secondary | ICD-10-CM | POA: Diagnosis present

## 2016-05-24 DIAGNOSIS — Z801 Family history of malignant neoplasm of trachea, bronchus and lung: Secondary | ICD-10-CM | POA: Diagnosis not present

## 2016-05-24 LAB — TSH: TSH: 1.016 u[IU]/mL (ref 0.350–4.500)

## 2016-05-24 LAB — URINALYSIS COMPLETE WITH MICROSCOPIC (ARMC ONLY)
BACTERIA UA: NONE SEEN
BILIRUBIN URINE: NEGATIVE
GLUCOSE, UA: NEGATIVE mg/dL
Ketones, ur: NEGATIVE mg/dL
LEUKOCYTES UA: NEGATIVE
Nitrite: NEGATIVE
Protein, ur: NEGATIVE mg/dL
Specific Gravity, Urine: 1.019 (ref 1.005–1.030)
pH: 5 (ref 5.0–8.0)

## 2016-05-24 LAB — COMPREHENSIVE METABOLIC PANEL
ALK PHOS: 62 U/L (ref 38–126)
ALT: 17 U/L (ref 17–63)
AST: 23 U/L (ref 15–41)
Albumin: 3.2 g/dL — ABNORMAL LOW (ref 3.5–5.0)
Anion gap: 8 (ref 5–15)
BUN: 21 mg/dL — AB (ref 6–20)
CALCIUM: 9 mg/dL (ref 8.9–10.3)
CHLORIDE: 109 mmol/L (ref 101–111)
CO2: 21 mmol/L — AB (ref 22–32)
CREATININE: 1.12 mg/dL (ref 0.61–1.24)
GFR calc non Af Amer: 59 mL/min — ABNORMAL LOW (ref >60)
GLUCOSE: 118 mg/dL — AB (ref 65–99)
Potassium: 3.5 mmol/L (ref 3.5–5.1)
SODIUM: 138 mmol/L (ref 135–145)
Total Bilirubin: 0.7 mg/dL (ref 0.3–1.2)
Total Protein: 5.9 g/dL — ABNORMAL LOW (ref 6.5–8.1)

## 2016-05-24 LAB — CBC
HCT: 34.4 % — ABNORMAL LOW (ref 40.0–52.0)
Hemoglobin: 11.7 g/dL — ABNORMAL LOW (ref 13.0–18.0)
MCH: 29.6 pg (ref 26.0–34.0)
MCHC: 34.1 g/dL (ref 32.0–36.0)
MCV: 87 fL (ref 80.0–100.0)
PLATELETS: 250 10*3/uL (ref 150–440)
RBC: 3.96 MIL/uL — AB (ref 4.40–5.90)
RDW: 13.3 % (ref 11.5–14.5)
WBC: 9.8 10*3/uL (ref 3.8–10.6)

## 2016-05-24 LAB — GLUCOSE, CAPILLARY: GLUCOSE-CAPILLARY: 117 mg/dL — AB (ref 65–99)

## 2016-05-24 LAB — LACTIC ACID, PLASMA: LACTIC ACID, VENOUS: 0.8 mmol/L (ref 0.5–1.9)

## 2016-05-24 LAB — MRSA PCR SCREENING: MRSA BY PCR: NEGATIVE

## 2016-05-24 MED ORDER — PANTOPRAZOLE SODIUM 40 MG PO TBEC
40.0000 mg | DELAYED_RELEASE_TABLET | Freq: Two times a day (BID) | ORAL | Status: DC
  Administered 2016-05-24 – 2016-05-26 (×5): 40 mg via ORAL
  Filled 2016-05-24 (×5): qty 1

## 2016-05-24 MED ORDER — ACETAMINOPHEN 650 MG RE SUPP: 650.0000 mg | Freq: Four times a day (QID) | RECTAL | Status: DC | PRN

## 2016-05-24 MED ORDER — BOOST / RESOURCE BREEZE PO LIQD
1.0000 | Freq: Two times a day (BID) | ORAL | Status: DC
  Administered 2016-05-25 – 2016-05-26 (×3): 1 via ORAL

## 2016-05-24 MED ORDER — LORAZEPAM 2 MG/ML IJ SOLN: 1.0000 mg | INTRAMUSCULAR | Status: DC | PRN

## 2016-05-24 MED ORDER — FERROUS SULFATE 325 (65 FE) MG PO TABS
325.0000 mg | ORAL_TABLET | Freq: Two times a day (BID) | ORAL | Status: DC
  Administered 2016-05-24 – 2016-05-26 (×5): 325 mg via ORAL
  Filled 2016-05-24 (×5): qty 1

## 2016-05-24 MED ORDER — ACETAMINOPHEN 325 MG PO TABS
650.0000 mg | ORAL_TABLET | Freq: Four times a day (QID) | ORAL | Status: DC | PRN
  Administered 2016-05-26: 650 mg via ORAL
  Filled 2016-05-24: qty 2

## 2016-05-24 MED ORDER — ENOXAPARIN SODIUM 40 MG/0.4ML ~~LOC~~ SOLN
40.0000 mg | SUBCUTANEOUS | Status: DC
  Administered 2016-05-24 – 2016-05-25 (×2): 40 mg via SUBCUTANEOUS
  Filled 2016-05-24 (×2): qty 0.4

## 2016-05-24 MED ORDER — SODIUM CHLORIDE 0.9 % IV SOLN
INTRAVENOUS | Status: DC
  Administered 2016-05-24 – 2016-05-25 (×3): via INTRAVENOUS

## 2016-05-24 MED ORDER — SENNOSIDES-DOCUSATE SODIUM 8.6-50 MG PO TABS
2.0000 | ORAL_TABLET | Freq: Two times a day (BID) | ORAL | Status: DC
  Administered 2016-05-24 – 2016-05-26 (×5): 2 via ORAL
  Filled 2016-05-24 (×6): qty 2

## 2016-05-24 MED ORDER — VENLAFAXINE HCL ER 75 MG PO CP24
150.0000 mg | ORAL_CAPSULE | Freq: Every day | ORAL | Status: DC
  Administered 2016-05-24 – 2016-05-26 (×3): 150 mg via ORAL
  Filled 2016-05-24 (×3): qty 2

## 2016-05-24 MED ORDER — LACOSAMIDE 50 MG PO TABS
50.0000 mg | ORAL_TABLET | Freq: Two times a day (BID) | ORAL | Status: DC
  Administered 2016-05-24 – 2016-05-26 (×5): 50 mg via ORAL
  Filled 2016-05-24 (×5): qty 1

## 2016-05-24 MED ORDER — POLYETHYLENE GLYCOL 3350 17 G PO PACK
17.0000 g | PACK | Freq: Every day | ORAL | Status: DC | PRN
Start: 2016-05-24 — End: 2016-05-26

## 2016-05-24 MED ORDER — SODIUM CHLORIDE 0.9% FLUSH
3.0000 mL | Freq: Two times a day (BID) | INTRAVENOUS | Status: DC
  Administered 2016-05-24 – 2016-05-25 (×3): 3 mL via INTRAVENOUS

## 2016-05-24 MED ORDER — METOPROLOL SUCCINATE ER 25 MG PO TB24
25.0000 mg | ORAL_TABLET | Freq: Every day | ORAL | Status: DC
  Administered 2016-05-24 – 2016-05-25 (×2): 25 mg via ORAL
  Filled 2016-05-24 (×3): qty 1

## 2016-05-24 MED ORDER — ONDANSETRON HCL 4 MG PO TABS: 4.0000 mg | ORAL_TABLET | Freq: Four times a day (QID) | ORAL | Status: DC | PRN

## 2016-05-24 MED ORDER — LIDOCAINE VISCOUS 2 % MT SOLN
15.0000 mL | OROMUCOSAL | Status: DC | PRN
  Administered 2016-05-24: 15 mL via OROMUCOSAL
  Filled 2016-05-24 (×2): qty 15

## 2016-05-24 MED ORDER — GADOBENATE DIMEGLUMINE 529 MG/ML IV SOLN
10.0000 mL | Freq: Once | INTRAVENOUS | Status: AC | PRN
Start: 2016-05-24 — End: 2016-05-24
  Administered 2016-05-24: 10 mL via INTRAVENOUS

## 2016-05-24 MED ORDER — SUCRALFATE 1 GM/10ML PO SUSP
1.0000 g | Freq: Three times a day (TID) | ORAL | Status: DC
  Administered 2016-05-24 – 2016-05-26 (×7): 1 g via ORAL
  Filled 2016-05-24 (×12): qty 10

## 2016-05-24 MED ORDER — QUETIAPINE FUMARATE 25 MG PO TABS
25.0000 mg | ORAL_TABLET | Freq: Every day | ORAL | Status: DC
  Administered 2016-05-24 – 2016-05-25 (×2): 25 mg via ORAL
  Filled 2016-05-24 (×2): qty 1

## 2016-05-24 MED ORDER — LORAZEPAM 1 MG PO TABS
1.0000 mg | ORAL_TABLET | Freq: Two times a day (BID) | ORAL | Status: DC
  Administered 2016-05-24 – 2016-05-26 (×4): 1 mg via ORAL
  Filled 2016-05-24 (×5): qty 1

## 2016-05-24 MED ORDER — BUPROPION HCL ER (XL) 150 MG PO TB24: 450.0000 mg | ORAL_TABLET | Freq: Every day | ORAL | Status: DC

## 2016-05-24 MED ORDER — ONDANSETRON HCL 4 MG/2ML IJ SOLN: 4.0000 mg | Freq: Four times a day (QID) | INTRAMUSCULAR | Status: DC | PRN

## 2016-05-24 MED ORDER — MIRTAZAPINE 15 MG PO TABS
15.0000 mg | ORAL_TABLET | Freq: Every day | ORAL | Status: DC
  Filled 2016-05-24: qty 1

## 2016-05-24 NOTE — Consult Note (Signed)
Reason for Consult:Seizures Referring Physician: Renae Gloss  CC: Seizures  HPI: Benjamin Kim is an 82 y.o. male with a history of dementia and depression who was brought in by EMS for seizure witnessed by son.  Seizure described as a GTC seizure.  Patient had associated tongue biting.  Per report of wife has had a seizure in December of last year and in May.  Did not seek medical attention for either of these episodes because he quickly returned to baseline.  Per report of wife patient currently at baseline.  Last seizure was this morning.    Past Medical History:  Diagnosis Date  . Anxiety   . Asthma   . CKD (chronic kidney disease) stage 3, GFR 30-59 ml/min   . Dementia   . Depressed   . Hypercholesteremia   . Hypertension     Past Surgical History:  Procedure Laterality Date  . PROSTATE SURGERY      Family History  Problem Relation Age of Onset  . Lung cancer Father     Social History:  reports that he has never smoked. He has never used smokeless tobacco. He reports that he does not drink alcohol. His drug history is not on file.  Allergies  Allergen Reactions  . Ivp Dye [Iodinated Diagnostic Agents] Anaphylaxis    Medications:  I have reviewed the patient's current medications. Prior to Admission:  Prescriptions Prior to Admission  Medication Sig Dispense Refill Last Dose  . buPROPion (WELLBUTRIN XL) 150 MG 24 hr tablet Take three tablets by mouth once daily for anxiety  4 Taking  . feeding supplement (BOOST / RESOURCE BREEZE) LIQD Take 1 Container by mouth 2 (two) times daily with a meal.  0 Taking  . ferrous sulfate 325 (65 FE) MG tablet Take 1 tablet (325 mg total) by mouth 2 (two) times daily with a meal.  3 Taking  . LORazepam (ATIVAN) 0.5 MG tablet Take one tablet by mouth twice daily at 1pm and every night at bedtime for anxiety 60 tablet 5 Taking  . metoprolol succinate (TOPROL-XL) 25 MG 24 hr tablet Take 1 tablet (25 mg total) by mouth daily. (Patient taking  differently: Take one tablet by mouth once daily for hypertension)   Taking  . mirtazapine (REMERON) 15 MG tablet Take 1 tablet by mouth at bedtime.  11 Taking  . pantoprazole (PROTONIX) 40 MG tablet Take 1 tablet (40 mg total) by mouth 2 (two) times daily before a meal. 30 tablet 0 Taking  . polyethylene glycol (MIRALAX / GLYCOLAX) packet Take 17 g by mouth daily as needed for moderate constipation or severe constipation. 14 each 0 Taking  . senna-docusate (SENOKOT-S) 8.6-50 MG tablet Take 2 tablets by mouth 2 (two) times daily.   Taking  . sucralfate (CARAFATE) 1 GM/10ML suspension Take 10 mLs (1 g total) by mouth 4 (four) times daily -  with meals and at bedtime. 420 mL 0 Taking  . venlafaxine XR (EFFEXOR-XR) 75 MG 24 hr capsule Take two tablets by mouth once daily  4 Taking   Scheduled: . buPROPion  450 mg Oral Daily  . enoxaparin (LOVENOX) injection  40 mg Subcutaneous Q24H  . feeding supplement  1 Container Oral BID WC  . ferrous sulfate  325 mg Oral BID WC  . LORazepam  1 mg Oral BID  . metoprolol succinate  25 mg Oral Daily  . mirtazapine  15 mg Oral QHS  . pantoprazole  40 mg Oral BID AC  . senna-docusate  2 tablet Oral BID  . sodium chloride flush  3 mL Intravenous Q12H  . sucralfate  1 g Oral TID WC & HS  . venlafaxine XR  150 mg Oral Q breakfast    ROS: Unable to obtain due to mental status  Physical Examination: Blood pressure 129/60, pulse 92, temperature 98.8 F (37.1 C), temperature source Oral, resp. rate 18, height 5\' 8"  (1.727 m), weight 61.5 kg (135 lb 9.6 oz), SpO2 100 %.  HEENT-  Normocephalic, no lesions, without obvious abnormality.  Normal external eye and conjunctiva.  Normal TM's bilaterally.  Normal auditory canals and external ears. Normal external nose, mucus membranes and septum.  Normal pharynx. Cardiovascular- S1, S2 normal, pulses palpable throughout   Lungs- chest clear, no wheezing, rales, normal symmetric air entry Abdomen- soft, non-tender; bowel  sounds normal; no masses,  no organomegaly Extremities- no edema.  Pain to touch throughout. Lymph-no adenopathy palpable Musculoskeletal-no joint tenderness, deformity or swelling Skin-warm and dry, no hyperpigmentation, vitiligo, or suspicious lesions  Neurological Examination Mental Status: Alert and awake.  Mumbles with unintelligible speech.  Does not follow commands.   Cranial Nerves: II: Discs flat bilaterally; Blinks to bilateral confrontation.  Pupils equal, round, reactive to light and accommodation III,IV, VI: ptosis not present, extra-ocular motions grossly intact  V,VII: decreased right NLF VIII: hearing normal bilaterally IX,X: gag reflex present XI: bilateral shoulder shrug XII: unable to test Motor: Moves all extremities spontaneously.  Unable to perform formal testing but no focal weakness appreciated.   Sensory: Responds to noxious stimuli throughout Deep Tendon Reflexes: 2+ and symmetric with absent AJ's Plantars: Right: downgoing   Left: downgoing Cerebellar: Unable to perform Gait: Unable to perform due to safety concerns   Laboratory Studies:   Basic Metabolic Panel:  Recent Labs Lab 05/23/16 2031 05/24/16 0426  NA 138 138  K 4.3 3.5  CL 106 109  CO2 26 21*  GLUCOSE 115* 118*  BUN 24* 21*  CREATININE 1.53* 1.12  CALCIUM 9.8 9.0    Liver Function Tests:  Recent Labs Lab 05/23/16 2031 05/24/16 0426  AST 36 23  ALT 19 17  ALKPHOS 71 62  BILITOT 0.4 0.7  PROT 6.6 5.9*  ALBUMIN 3.7 3.2*    Recent Labs Lab 05/23/16 2031  LIPASE 24   No results for input(s): AMMONIA in the last 168 hours.  CBC:  Recent Labs Lab 05/23/16 2031 05/24/16 0426  WBC 8.1 9.8  NEUTROABS 6.3  --   HGB 12.3* 11.7*  HCT 37.3* 34.4*  MCV 88.2 87.0  PLT 280 250    Cardiac Enzymes:  Recent Labs Lab 05/23/16 2031  TROPONINI <0.03    BNP: Invalid input(s): POCBNP  CBG:  Recent Labs Lab 05/24/16 0726  GLUCAP 117*    Microbiology: No  results found for this or any previous visit.  Coagulation Studies: No results for input(s): LABPROT, INR in the last 72 hours.  Urinalysis:  Recent Labs Lab 05/23/16 2031  COLORURINE YELLOW*  LABSPEC 1.012  PHURINE 6.0  GLUCOSEU NEGATIVE  HGBUR NEGATIVE  BILIRUBINUR NEGATIVE  KETONESUR NEGATIVE  PROTEINUR NEGATIVE  NITRITE NEGATIVE  LEUKOCYTESUR NEGATIVE    Lipid Panel:  No results found for: CHOL, TRIG, HDL, CHOLHDL, VLDL, LDLCALC  HgbA1C: No results found for: HGBA1C  Urine Drug Screen:  No results found for: LABOPIA, COCAINSCRNUR, LABBENZ, AMPHETMU, THCU, LABBARB  Alcohol Level: No results for input(s): ETH in the last 168 hours.  Other results: EKG: sinus tachycardia at 101 bpm.  Imaging:  Ct Head Wo Contrast  Result Date: 05/23/2016 CLINICAL DATA:  Witnessed syncopal episode while sitting on bed. History of dementia, hypertension and hypercholesterolemia. EXAM: CT HEAD WITHOUT CONTRAST TECHNIQUE: Contiguous axial images were obtained from the base of the skull through the vertex without intravenous contrast. COMPARISON:  CT HEAD July 12, 2015 FINDINGS: INTRACRANIAL CONTENTS: The ventricles and sulci are normal for age. No intraparenchymal hemorrhage, mass effect nor midline shift. Patchy to confluent supratentorial white matter hypodensities are similar. RIGHT inferior basal ganglia perivascular space. Old LEFT basal ganglia lacunar infarct. No acute large vascular territory infarcts. No abnormal extra-axial fluid collections. Basal cisterns are patent. Mild calcific atherosclerosis of the carotid siphons. ORBITS: The included ocular globes and orbital contents are non-suspicious. Status post bilateral ocular lens implants. SINUSES: The mastoid aircells and included paranasal sinuses are well-aerated. SKULL/SOFT TISSUES: No skull fracture. No significant soft tissue swelling. RIGHT occipital scalp lipoma. IMPRESSION: No acute intracranial process. Stable involutional  changes and moderate to severe chronic small vessel ischemic disease. Old LEFT basal ganglia lacunar infarct. Electronically Signed   By: Awilda Metro M.D.   On: 05/23/2016 22:26   Mr Laqueta Jean WU Contrast  Result Date: 05/24/2016 CLINICAL DATA:  Seizure.  Dementia. EXAM: MRI HEAD WITHOUT AND WITH CONTRAST TECHNIQUE: Multiplanar, multiecho pulse sequences of the brain and surrounding structures were obtained without and with intravenous contrast. CONTRAST:  10mL MULTIHANCE GADOBENATE DIMEGLUMINE 529 MG/ML IV SOLN COMPARISON:  CT head 05/23/2016 FINDINGS: Image quality degraded by motion Moderate atrophy.  Negative for hydrocephalus. Negative for acute infarct. Moderate chronic microvascular ischemic change throughout the cerebral white matter. Chronic infarct in the right pons. Chronic infarct left basal ganglia. Negative for intracranial hemorrhage.  No fluid collection. Negative for mass or edema.  No shift of the midline structures. Postcontrast images degraded by motion. Allowing for this, no enhancing mass lesion identified. Normal venous enhancement. Circle of Willis patent. Paranasal sinuses clear. Bilateral cataract extraction. Pituitary normal in size. IMPRESSION: Moderate atrophy and moderate chronic microvascular ischemia. No acute abnormality. Electronically Signed   By: Marlan Palau M.D.   On: 05/24/2016 10:03   Dg Chest Port 1 View  Result Date: 05/23/2016 CLINICAL DATA:  Patient lives in a nursing facility. Witnessed syncopal episode while sitting on a bed. EXAM: PORTABLE CHEST 1 VIEW COMPARISON:  03/06/2015, 07/14/2015 FINDINGS: Heart size is upper normal. Large hiatal hernia is present. There are no focal consolidations or pleural effusions. No pulmonary edema. IMPRESSION: No evidence for acute  abnormality.  Hiatal hernia. Electronically Signed   By: Norva Pavlov M.D.   On: 05/23/2016 20:58     Assessment/Plan: 82 year old male with history of depression and dementia now s/p  four seizures.  Patient on Wellbutrin.  Has been on current dose for at least 16 months after review of the chart, at least 8 months prior to first seizure.  Although Wellbutrin may be contributing to presentation, his dementia is likely the biggest contributing factor.  MRI of the brain personally reviewed and shows no acute changes.  EEG pending.    Recommendations: 1.  D/C Wellbutrin 2.  Start Vimpat  BID.  First dose today.   3.  Continue seizure precautions 4.  Ativan prn seizure activity.    Thana Farr, MD Neurology [phone] 05/24/2016, 1:21 PM

## 2016-05-24 NOTE — Progress Notes (Signed)
[facility] PROGRESS NOTE  BRENTTON WARDLOW YQM:[id number] DOB: 12/09/1931 DOA: 05/23/2016 PCP: Lauro Regulus., MD  HPI/Subjective: Patient's major complaint is tongue pain. Patient had 4 seizures from yesterday till this morning. Patient with dementia and is not the best historian. Wife at the bedside to help out.  Objective: Vitals:   05/24/16 1206 05/24/16 1445  BP: 129/60 125/72  Pulse: 92 84  Resp: 18 18  Temp: 98.8 F (37.1 C) 99.6 F (37.6 C)    Filed Weights   05/23/16 2005 05/24/16 0135  Weight: 59.9 kg (132 lb) 61.5 kg (135 lb 9.6 oz)    ROS: Review of Systems  Constitutional: Negative for chills and fever.  Eyes: Negative for blurred vision.  Respiratory: Negative for cough and shortness of breath.   Cardiovascular: Negative for chest pain.  Gastrointestinal: Negative for abdominal pain, constipation, diarrhea, nausea and vomiting.  Genitourinary: Negative for dysuria.  Musculoskeletal: Negative for joint pain.  Neurological: Negative for dizziness and headaches.   Exam: Physical Exam  HENT:  Nose: No mucosal edema.  Mouth/Throat: No oropharyngeal exudate or posterior oropharyngeal edema.  Tongue bite left lateral tongue. Also bitten lip.  Eyes: Conjunctivae, EOM and lids are normal. Pupils are equal, round, and reactive to light.  Neck: No JVD present. Carotid bruit is not present. No edema present. No thyroid mass and no thyromegaly present.  Cardiovascular: S1 normal and S2 normal.  Exam reveals no gallop.   No murmur heard. Pulses:      Dorsalis pedis pulses are 2+ on the right side, and 2+ on the left side.  Respiratory: No respiratory distress. He has no wheezes. He has no rhonchi. He has no rales.  GI: Soft. Bowel sounds are normal. There is no tenderness.  Musculoskeletal:       Right ankle: He exhibits no swelling.       Left ankle: He exhibits no swelling.  Lymphadenopathy:    He has no cervical adenopathy.  Neurological: He is alert.  No cranial nerve deficit.  Able to lift each leg up off the bed. Able to lift each arm off the bed.  Skin: Skin is warm. No rash noted. Nails show no clubbing.  Psychiatric: He has a normal mood and affect.      Data Reviewed: Basic Metabolic Panel:  Recent Labs Lab 05/23/16 2031 05/24/16 0426  NA 138 138  K 4.3 3.5  CL 106 109  CO2 26 21*  GLUCOSE 115* 118*  BUN 24* 21*  CREATININE 1.53* 1.12  CALCIUM 9.8 9.0   Liver Function Tests:  Recent Labs Lab 05/23/16 2031 05/24/16 0426  AST 36 23  ALT 19 17  ALKPHOS 71 62  BILITOT 0.4 0.7  PROT 6.6 5.9*  ALBUMIN 3.7 3.2*    Recent Labs Lab 05/23/16 2031  LIPASE 24   CBC:  Recent Labs Lab 05/23/16 2031 05/24/16 0426  WBC 8.1 9.8  NEUTROABS 6.3  --   HGB 12.3* 11.7*  HCT 37.3* 34.4*  MCV 88.2 87.0  PLT 280 250   Cardiac Enzymes:  Recent Labs Lab 05/23/16 2031  TROPONINI <0.03    CBG:  Recent Labs Lab 05/24/16 0726  GLUCAP 117*    Recent Results (from the past 240 hour(s))  MRSA PCR Screening     Status: None   Collection Time: 05/24/16 12:07 PM  Result Value Ref Range Status   MRSA by PCR NEGATIVE NEGATIVE Final    Comment:        The  GeneXpert MRSA Assay (FDA approved for NASAL specimens only), is one component of a comprehensive MRSA colonization surveillance program. It is not intended to diagnose MRSA infection nor to guide or monitor treatment for MRSA infections.      Studies: Ct Head Wo Contrast  Result Date: 05/23/2016 CLINICAL DATA:  Witnessed syncopal episode while sitting on bed. History of dementia, hypertension and hypercholesterolemia. EXAM: CT HEAD WITHOUT CONTRAST TECHNIQUE: Contiguous axial images were obtained from the base of the skull through the vertex without intravenous contrast. COMPARISON:  CT HEAD July 12, 2015 FINDINGS: INTRACRANIAL CONTENTS: The ventricles and sulci are normal for age. No intraparenchymal hemorrhage, mass effect nor midline shift.  Patchy to confluent supratentorial white matter hypodensities are similar. RIGHT inferior basal ganglia perivascular space. Old LEFT basal ganglia lacunar infarct. No acute large vascular territory infarcts. No abnormal extra-axial fluid collections. Basal cisterns are patent. Mild calcific atherosclerosis of the carotid siphons. ORBITS: The included ocular globes and orbital contents are non-suspicious. Status post bilateral ocular lens implants. SINUSES: The mastoid aircells and included paranasal sinuses are well-aerated. SKULL/SOFT TISSUES: No skull fracture. No significant soft tissue swelling. RIGHT occipital scalp lipoma. IMPRESSION: No acute intracranial process. Stable involutional changes and moderate to severe chronic small vessel ischemic disease. Old LEFT basal ganglia lacunar infarct. Electronically Signed   By: Awilda Metro M.D.   On: 05/23/2016 22:26   Mr Laqueta Jean BJ Contrast  Result Date: 05/24/2016 CLINICAL DATA:  Seizure.  Dementia. EXAM: MRI HEAD WITHOUT AND WITH CONTRAST TECHNIQUE: Multiplanar, multiecho pulse sequences of the brain and surrounding structures were obtained without and with intravenous contrast. CONTRAST:  10mL MULTIHANCE GADOBENATE DIMEGLUMINE 529 MG/ML IV SOLN COMPARISON:  CT head 05/23/2016 FINDINGS: Image quality degraded by motion Moderate atrophy.  Negative for hydrocephalus. Negative for acute infarct. Moderate chronic microvascular ischemic change throughout the cerebral white matter. Chronic infarct in the right pons. Chronic infarct left basal ganglia. Negative for intracranial hemorrhage.  No fluid collection. Negative for mass or edema.  No shift of the midline structures. Postcontrast images degraded by motion. Allowing for this, no enhancing mass lesion identified. Normal venous enhancement. Circle of Willis patent. Paranasal sinuses clear. Bilateral cataract extraction. Pituitary normal in size. IMPRESSION: Moderate atrophy and moderate chronic microvascular  ischemia. No acute abnormality. Electronically Signed   By: Marlan Palau M.D.   On: 05/24/2016 10:03   Dg Chest Port 1 View  Result Date: 05/23/2016 CLINICAL DATA:  Patient lives in a nursing facility. Witnessed syncopal episode while sitting on a bed. EXAM: PORTABLE CHEST 1 VIEW COMPARISON:  03/06/2015, 07/14/2015 FINDINGS: Heart size is upper normal. Large hiatal hernia is present. There are no focal consolidations or pleural effusions. No pulmonary edema. IMPRESSION: No evidence for acute  abnormality.  Hiatal hernia. Electronically Signed   By: Norva Pavlov M.D.   On: 05/23/2016 20:58    Scheduled Meds: . enoxaparin (LOVENOX) injection  40 mg Subcutaneous Q24H  . feeding supplement  1 Container Oral BID WC  . ferrous sulfate  325 mg Oral BID WC  . lacosamide  50 mg Oral BID  . LORazepam  1 mg Oral BID  . metoprolol succinate  25 mg Oral Daily  . pantoprazole  40 mg Oral BID AC  . QUEtiapine  25 mg Oral QHS  . senna-docusate  2 tablet Oral BID  . sodium chloride flush  3 mL Intravenous Q12H  . sucralfate  1 g Oral TID WC & HS  . venlafaxine XR  150  mg Oral Q breakfast   Continuous Infusions: . sodium chloride 75 mL/hr at 05/24/16 0154    Assessment/Plan:  1. Repeated seizures. MRI of the brain negative for mass or stroke. EEG showing slowing. Neurology started on Vimpat 50 mg twice a day. Wellbutrin stopped because can lower the patient's threshold for seizure. Viscous lidocaine to tongue. 2. Acute kidney injury on chronic kidney disease stage III. Give gentle IV fluid hydration. 3. Essential hypertension on numerous medications for blood pressure. Continue to monitor blood pressure closely. 4. Dementia. Start Seroquel at night and discontinue Remeron. Patient is at a memory care unit 5. Weakness. Get physical therapy consultation  Code Status:     Code Status Orders        Start     Ordered   05/24/16 0143  Full code  Continuous     05/24/16 0143    Code Status  History    Date Active Date Inactive Code Status Order ID Comments User Context   07/12/2015 11:24 AM 07/16/2015  7:21 PM DNR [id number]  Gale Journeyatherine P Walsh, MD ED   07/12/2015  9:31 AM 07/12/2015 11:24 AM Full Code [id number]  Milagros LollSrikar Sudini, MD ED     Family Communication: Wife at the bedside Disposition Plan: Potential disposition tomorrow or the next day depending on the patient's clinical course  Consultants:  Neurology  Time spent: 35 minutes  Alford HighlandWIETING, Jedi Catalfamo  [employer] [facility]

## 2016-05-24 NOTE — Care Management Note (Signed)
Case Management Note  Patient Details  Name: Benjamin Kim MRN: [id number] Date of Birth: 12/28/1931  Subjective/Objective:                  Spoke with spouse [name], [facility]. Sposue stated only been there about a week and that she is the one that arranged this. Patient has DME walkers and shower bench as well as WC at home.  Did have HH Aid as well. Spouse stated that she could not handle the patient at night. She added that she has LTC insurance. CSW is following this patient for [facility] . Will sign off for now.    Action/Plan: [facility]   Expected Discharge Date:                  Expected Discharge Plan:     In-House Referral:  Clinical Social Work  Discharge planning Services  CM Consult  Post Acute Care Choice:    Choice offered to:     DME Arranged:    DME Agency:     HH Arranged:    HH Agency:     Status of Service:  Completed, signed off  If discussed at [employer] Meetings, dates discussed:    Additional Comments:  Adonis HugueninBerkhead, Velna Hedgecock L, RN 05/24/2016, 10:25 AM

## 2016-05-24 NOTE — Progress Notes (Signed)
PT Cancellation Note  Patient Details Name: Benjamin MallickKenneth L Mehl MRN: [id number] DOB: [birth year]   Cancelled Treatment:     Attempted to see pt x 3 this date with pt unavailable each time including out for multiple tests.  Pt's spouse requested no eval this date secondary to pt agitated after returning from tests and is resistant to moving.  Spouse requests to hold PT eval until 05/25/16.   Thomes Dinningavid Scott Janazia Schreier, PT 05/24/2016, 2:32 PM

## 2016-05-24 NOTE — H&P (Signed)
[facility] - [place] @ [facility] Admission History and Physical Benjamin Kim, D.O.  ---------------------------------------------------------------------------------------------------------------------   PATIENT NAME: Benjamin Kim MR#: [id number] DATE OF BIRTH: 10/01/1932 DATE OF ADMISSION: 05/23/2016 PRIMARY CARE PHYSICIAN: Lauro Regulus., MD  REQUESTING/REFERRING PHYSICIAN: ED Dr. Fanny Bien  CHIEF COMPLAINT: Chief Complaint  Patient presents with  . Loss of Consciousness    HISTORY OF PRESENT ILLNESS:  Obtained entirely from family as patient has severe dementia.  Benjamin Kim is a 82 y.o. male with a known history of dementia, HTN, HLD, CKD was in a usual state of health until this afternoon when his son witnessed one minute of tonic clonic seizure activity.  Patient was not alert during the episode and returned to baseline (dementia/confusion) after the event.  He did bite his tongue but did not lose continence.  His wife states that this has happened several times before, most recently about one year ago.  He has not sought medical attention following any prior seizure activity.   Of note he has recently been moved to a dementia living facility which has been upsetting to him.    Otherwise there has been no change in status. Patient has been taking medication as prescribed and there has been no recent change in medication or diet.  There has been no recent illness, travel or sick contacts.    Patient denies fevers/chills, weakness, dizziness, chest pain, shortness of breath, N/V/C/D, abdominal pain, dysuria/frequency, changes in mental status.    PAST MEDICAL HISTORY: Past Medical History:  Diagnosis Date  . Anxiety   . Asthma   . CKD (chronic kidney disease) stage 3, GFR 30-59 ml/min   . Dementia   . Depressed   . Hypercholesteremia   . Hypertension       PAST SURGICAL HISTORY: Past Surgical History:  Procedure Laterality Date  . PROSTATE SURGERY         SOCIAL HISTORY: Social History  Substance Use Topics  . Smoking status: Never Smoker  . Smokeless tobacco: Never Used  . Alcohol use No      FAMILY HISTORY: Family History  Problem Relation Age of Onset  . Lung cancer Father      MEDICATIONS AT HOME: Prior to Admission medications   Medication Sig Start Date End Date Taking? Authorizing Provider  buPROPion (WELLBUTRIN XL) 150 MG 24 hr tablet Take three tablets by mouth once daily for anxiety 07/07/15   Historical Provider, MD  feeding supplement (BOOST / RESOURCE BREEZE) LIQD Take 1 Container by mouth 2 (two) times daily with a meal. 07/15/15   Milagros Loll, MD  ferrous sulfate 325 (65 FE) MG tablet Take 1 tablet (325 mg total) by mouth 2 (two) times daily with a meal. 07/15/15   Milagros Loll, MD  LORazepam (ATIVAN) 0.5 MG tablet Take one tablet by mouth twice daily at 1pm and every night at bedtime for anxiety 07/27/15   Tiffany L Reed, DO  metoprolol succinate (TOPROL-XL) 25 MG 24 hr tablet Take 1 tablet (25 mg total) by mouth daily. Patient taking differently: Take one tablet by mouth once daily for hypertension 07/15/15   Milagros Loll, MD  mirtazapine (REMERON) 15 MG tablet Take 1 tablet by mouth at bedtime. 06/12/15   Historical Provider, MD  pantoprazole (PROTONIX) 40 MG tablet Take 1 tablet (40 mg total) by mouth 2 (two) times daily before a meal. 07/15/15   Srikar Sudini, MD  polyethylene glycol (MIRALAX / GLYCOLAX) packet Take 17 g by mouth daily as needed for moderate  constipation or severe constipation. 07/15/15   Srikar Sudini, MD  senna-docusate (SENOKOT-S) 8.6-50 MG tablet Take 2 tablets by mouth 2 (two) times daily. 07/15/15   Srikar Sudini, MD  sucralfate (CARAFATE) 1 GM/10ML suspension Take 10 mLs (1 g total) by mouth 4 (four) times daily -  with meals and at bedtime. 07/15/15   Milagros LollSrikar Sudini, MD  venlafaxine XR (EFFEXOR-XR) 75 MG 24 hr capsule Take two tablets by mouth once daily 06/16/15   Historical Provider, MD       DRUG ALLERGIES: Allergies  Allergen Reactions  . Ivp Dye [Iodinated Diagnostic Agents] Anaphylaxis     REVIEW OF SYSTEMS: CONSTITUTIONAL: No fever/chills. No fatigue, weakness. No weight gain, no weight loss. EYES: No blurry or double vision. ENT: No tinnitus. No postnasal drip. No redness or soreness of the oropharynx. RESPIRATORY: No cough, no wheeze, no hemoptysis. No dyspnea. CARDIOVASCULAR: No chest pain. No orthopnea. No palpitations. No syncope. GASTROINTESTINAL: No nausea, no vomiting or diarrhea. No abdominal pain. No melena or hematochezia. GENITOURINARY: No dysuria or hematuria. ENDOCRINE: No polyuria or nocturia. No heat or cold intolerance. HEMATOLOGY: No anemia. No bruising. No bleeding. INTEGUMENTARY: No rashes. No lesions. MUSCULOSKELETAL: No arthritis. No swelling. No gout. NEUROLOGIC: No numbness, tingling, weakness or ataxia. (+) seizure-type activity. PSYCHIATRIC: No anxiety. No depression. No insomnia.  PHYSICAL EXAMINATION: VITAL SIGNS: Blood pressure 128/74, pulse (!) 103, resp. rate 15, height 5\' 10"  (1.778 m), weight 59.9 kg (132 lb), SpO2 98 %.  GENERAL: 82 y.o.-year-old white male patient, well-developed, well-nourished lying in the bed, weepy at times, confused but in no acute distress. EYES: Pupils equal, round, reactive to light and accommodation. No scleral icterus. Extraocular muscles intact. HEENT: Head atraumatic, normocephalic. Oropharynx and nasopharynx clear. Mucus membranes dry.  Right lateral tongue laceration.  NECK: Supple, full range of motion. No JVD, no bruit heard. No thyroid enlargement, no tenderness, no lymphadenopathy. CHEST: Normal breath sounds bilaterally, no wheezing, rales, rhonchi or crepitation. No use of accessory muscles of respiration.  No reproducible chest wall tenderness.  CARDIOVASCULAR: S1, S2 normal. No murmurs, rubs, or gallops. Cap refill <2 seconds. ABDOMEN: Soft, nontender, nondistended. No rebound, guarding,  rigidity. Normoactive bowel sounds present in all four quadrants. No organomegaly or mass. EXTREMITIES: Full range of motion. No pedal edema, cyanosis, or clubbing. NEUROLOGIC: Confused (baseline per family), Cranial nerves II through XII are grossly intact with no focal sensorimotor deficit. Muscle strength 5/5 in all extremities. Sensation intact. Gait not checked. PSYCHIATRIC: The patient is alert and oriented x 1.  SKIN: Warm, dry, and intact without obvious rash, lesion, or ulcer.  LABORATORY PANEL:  CBC  Recent Labs Lab 05/23/16 2031  WBC 8.1  HGB 12.3*  HCT 37.3*  PLT 280   ----------------------------------------------------------------------------------------------------------------- Chemistries  Recent Labs Lab 05/23/16 2031  NA 138  K 4.3  CL 106  CO2 26  GLUCOSE 115*  BUN 24*  CREATININE 1.53*  CALCIUM 9.8  AST 36  ALT 19  ALKPHOS 71  BILITOT 0.4   ------------------------------------------------------------------------------------------------------------------ Cardiac Enzymes  Recent Labs Lab 05/23/16 2031  TROPONINI <0.03   ------------------------------------------------------------------------------------------------------------------  RADIOLOGY: Ct Head Wo Contrast  Result Date: 05/23/2016 CLINICAL DATA:  Witnessed syncopal episode while sitting on bed. History of dementia, hypertension and hypercholesterolemia. EXAM: CT HEAD WITHOUT CONTRAST TECHNIQUE: Contiguous axial images were obtained from the base of the skull through the vertex without intravenous contrast. COMPARISON:  CT HEAD July 12, 2015 FINDINGS: INTRACRANIAL CONTENTS: The ventricles and sulci are normal for age. No intraparenchymal  hemorrhage, mass effect nor midline shift. Patchy to confluent supratentorial white matter hypodensities are similar. RIGHT inferior basal ganglia perivascular space. Old LEFT basal ganglia lacunar infarct. No acute large vascular territory infarcts. No  abnormal extra-axial fluid collections. Basal cisterns are patent. Mild calcific atherosclerosis of the carotid siphons. ORBITS: The included ocular globes and orbital contents are non-suspicious. Status post bilateral ocular lens implants. SINUSES: The mastoid aircells and included paranasal sinuses are well-aerated. SKULL/SOFT TISSUES: No skull fracture. No significant soft tissue swelling. RIGHT occipital scalp lipoma. IMPRESSION: No acute intracranial process. Stable involutional changes and moderate to severe chronic small vessel ischemic disease. Old LEFT basal ganglia lacunar infarct. Electronically Signed   By: Awilda Metro M.D.   On: 05/23/2016 22:26   Dg Chest Port 1 View  Result Date: 05/23/2016 CLINICAL DATA:  Patient lives in a nursing facility. Witnessed syncopal episode while sitting on a bed. EXAM: PORTABLE CHEST 1 VIEW COMPARISON:  03/06/2015, 07/14/2015 FINDINGS: Heart size is upper normal. Large hiatal hernia is present. There are no focal consolidations or pleural effusions. No pulmonary edema. IMPRESSION: No evidence for acute  abnormality.  Hiatal hernia. Electronically Signed   By: Norva Pavlov M.D.   On: 05/23/2016 20:58    EKG:  Sinus tach at 100bpm.  No significant ST-T wave changes.   IMPRESSION AND PLAN:  This is a 82 y.o. white male with a history of dementia, HTN, HLD, CKD, anxiety, asthma, depression now being admitted with: 1. Seizure - patient has history of seizure activity in the past but has not had a neurologic workup.  We will admit to telemetry for monitoring, neurochecks and neuro consult in AM for consideration of MRI, EEG or other testing.  CT brain shows only old infarcts and chronic small vessel ischemic disease.  No evidence of electrolyte abnormalities, but will repeat labs in AM.  2. Elevated lactate - IVFs and cultures of urine and blood and repeat lactate.   3. AKI on CKD - gentle fluid hydration, repeat BMP in AM.   Fluids:  IVNS  Diet/Nutrition: Heart healthy  DVT Px: Lovenox, SCDs.  All the records are reviewed and case discussed with ED provider. Management plans discussed with the patient and/or family who express understanding and agree with plan of care.  CODE STATUS: FULL TOTAL TIME TAKING CARE OF THIS PATIENT: 60 minutes.   Marquarius Lofton D.O. on 05/24/2016 at 12:31 AM Between 7am to 6pm - Pager - [phone] After 6pm go to [email or web address] - Social research officer, government [facility] [place] [facility] Office [phone] CC: Primary care physician; Lauro Regulus., MD     Note: This dictation was prepared with Dragon dictation along with smaller phrase technology. Any transcriptional errors that result from this process are unintentional.

## 2016-05-24 NOTE — Clinical Social Work Note (Signed)
Clinical Social Work Assessment  Patient Details  Name: Benjamin Kim MRN: [id number] Date of Birth: 06-18-1932  Date of referral:  05/24/16               Reason for consult:  Other (Comment Required) (From [facility]F )                Permission sought to share information with:  Facility Industrial/product designer granted to share information::  Yes, Verbal Permission Granted  Name::        Agency::     Relationship::     Contact Information:     Housing/Transportation Living arrangements for the past 2 months:  Assisted Living Facility Source of Information:  Adult Children Patient Interpreter Needed:  None Criminal Activity/Legal Involvement Pertinent to Current Situation/Hospitalization:  No - Comment as needed Significant Relationships:  Adult Children, Spouse Lives with:  Facility Resident Do you feel safe going back to the place where you live?    Need for family participation in patient care:  Yes (Comment)  Care giving concerns:  Patient has been a resident at [facility] since Friday 05/20/16 (fax: [phone])   Social Worker assessment / plan:  Clinical Social Worker (CSW) reviewed chart and noted that patient is from [facility] [employer] [facility]. CSW contacted Benjamin Kim and spoke with Benjamin Kim the Med Tech at the [facility]). Per Benjamin Kim patient is private pay and has been at [facility] since last Friday. Per Benjamin Kim patient came to [place] from home where he was living with his wife Benjamin Kim. Per Benjamin Kim patient uses a walker at baseline and has a plus 1 assistance when he is ambulating. Per Benjamin Kim patient does not ambulate without a staff member being near him. Per Benjamin Kim patient can return to [facility] if he is at baseline. CSW requested MD to order PT consult to determine if patient is at baseline.   CSW attempted to meet with patient and his wife Benjamin Kim however patient was off the floor and wife was not in the room. CSW attempted to contact  patient's wife however she did not answer. CSW called patient's son Benjamin Kim. Per son patient was living in [place] with his wife Benjamin Kim and recently moved into [place], which is 4 miles from their home. Son is agreeable for patient to return to [facility] when stable. CSW will continue to follow and assist as needed. FL2 complete.   Employment status:  Retired Database administrator PT Recommendations:  Not assessed at this time Information / Referral to community resources:  Other (Comment Required) (Return to Memory Care Unit ALF )  Patient/Family's Response to care:  Patient's son understands that patient is now inpatient and will likely be at [facility] a few days. Son is agreeable for patient to return to [facility].   Patient/Family's Understanding of and Emotional Response to Diagnosis, Current Treatment, and Prognosis:  Son was very pleasant and thanked CSW for assistance.   Emotional Assessment Appearance:    Attitude/Demeanor/Rapport:  Unable to Assess Affect (typically observed):  Unable to Assess Orientation:  Oriented to Self, Fluctuating Orientation (Suspected and/or reported Sundowners) Alcohol / Substance use:  Not Applicable Psych involvement (Current and /or in the community):  No (Comment)  Discharge Needs  Concerns to be addressed:  Discharge Planning Concerns Readmission within the last 30 days:  No Current discharge risk:  Chronically ill, Cognitively Impaired Barriers to Discharge:  Continued Medical Work up  Benjamin Kim, Darleen CrockerBailey M, LCSW 05/24/2016, 10:43 AM

## 2016-05-24 NOTE — [place] FL2 (Signed)
[place] MEDICAID FL2 LEVEL OF CARE SCREENING TOOL     IDENTIFICATION  Patient Name: Benjamin Kim Birthdate: April 07, 1932 Sex: male Admission Date (Current Location): 05/23/2016  [place]   Facility and Address:  [facility], [address], [place], [place] [address]      Provider Number: [id number]  Attending Physician Name and Address:  Alford Highlandichard Wieting, MD  Relative Name and Phone Number:       Current Level of Care: Hospital Recommended Level of Care: Assisted Living Facility, Memory Care Prior Approval Number:    Date Approved/Denied:   PASRR Number:  [id number])  Discharge Plan: Domiciliary (Rest home)    Current Diagnoses: Patient Active Problem List   Diagnosis Date Noted  . Seizure (HCC) 05/24/2016  . Protein-calorie malnutrition, severe (HCC) 07/14/2015  . Dementia with behavioral disturbance 07/13/2015  . Depression 07/13/2015  . Anemia 07/12/2015  . Gastrointestinal bleeding 07/12/2015    Orientation RESPIRATION BLADDER Height & Weight     Self  Normal Incontinent Weight: 135 lb 9.6 oz (61.5 kg) Height:  5\' 8"  (172.7 cm)  BEHAVIORAL SYMPTOMS/MOOD NEUROLOGICAL BOWEL NUTRITION STATUS   (none ) Convulsions/Seizures (History of Seizures) Continent Diet (Diet: Heart Healthy )  AMBULATORY STATUS COMMUNICATION OF NEEDS Skin   Supervision Verbally Normal                       Personal Care Assistance Level of Assistance  Bathing, Feeding, Dressing Bathing Assistance: Limited assistance Feeding assistance: Limited assistance Dressing Assistance: Limited assistance     Functional Limitations Info  Sight, Hearing, Speech Sight Info: Adequate Hearing Info: Adequate Speech Info: Adequate    SPECIAL CARE FACTORS FREQUENCY                       Contractures      Additional Factors Info  Code Status, Allergies Code Status Info:  (Full Code) Allergies Info:  (Ivp Dye Iodinated  Diagnostic Agents)           Current Medications (05/24/2016):  This is the current hospital active medication list Current Facility-Administered Medications  Medication Dose Route Frequency Provider Last Rate Last Dose  . 0.9 %  sodium chloride infusion   Intravenous Continuous Alexis Hugelmeyer, DO 75 mL/hr at 05/24/16 0154    . acetaminophen (TYLENOL) tablet 650 mg  650 mg Oral Q6H PRN Alexis Hugelmeyer, DO       Or  . acetaminophen (TYLENOL) suppository 650 mg  650 mg Rectal Q6H PRN Alexis Hugelmeyer, DO      . buPROPion (WELLBUTRIN XL) 24 hr tablet 450 mg  450 mg Oral Daily Alexis Hugelmeyer, DO      . enoxaparin (LOVENOX) injection 40 mg  40 mg Subcutaneous Q24H Alexis Hugelmeyer, DO      . feeding supplement (BOOST / RESOURCE BREEZE) liquid 1 Container  1 Container Oral BID WC Alexis Hugelmeyer, DO      . ferrous sulfate tablet 325 mg  325 mg Oral BID WC Alexis Hugelmeyer, DO      . LORazepam (ATIVAN) injection 1 mg  1 mg Intravenous Q4H PRN Pavan Pyreddy, MD      . LORazepam (ATIVAN) tablet 1 mg  1 mg Oral BID Alexis Hugelmeyer, DO      . metoprolol succinate (TOPROL-XL) 24 hr tablet 25 mg  25 mg Oral Daily Alexis Hugelmeyer, DO      . mirtazapine (REMERON) tablet 15 mg  15 mg Oral QHS Alexis Hugelmeyer, DO      . ondansetron (ZOFRAN) tablet 4 mg  4 mg Oral Q6H PRN Alexis Hugelmeyer, DO       Or  . ondansetron (ZOFRAN) injection 4 mg  4 mg Intravenous Q6H PRN Alexis Hugelmeyer, DO      . pantoprazole (PROTONIX) EC tablet 40 mg  40 mg Oral BID AC Alexis Hugelmeyer, DO      . polyethylene glycol (MIRALAX / GLYCOLAX) packet 17 g  17 g Oral Daily PRN Alexis Hugelmeyer, DO      . senna-docusate (Senokot-S) tablet 2 tablet  2 tablet Oral BID Alexis Hugelmeyer, DO      . sodium chloride flush (NS) 0.9 % injection 3 mL  3 mL Intravenous Q12H Alexis Hugelmeyer, DO   3 mL at 05/24/16 0217  . sucralfate (CARAFATE) 1 GM/10ML suspension 1 g  1 g Oral TID WC & HS Alexis Hugelmeyer, DO      .  venlafaxine XR (EFFEXOR-XR) 24 hr capsule 150 mg  150 mg Oral Q breakfast Alexis Hugelmeyer, DO         Discharge Medications: Please see discharge summary for a list of discharge medications.  Relevant Imaging Results:  Relevant Lab Results:   Additional Information  (SSN: [id number])  Johnanna Bakke, Darleen Crocker, LCSW

## 2016-05-25 ENCOUNTER — Inpatient Hospital Stay: Payer: Medicare Other

## 2016-05-25 MED ORDER — IPRATROPIUM-ALBUTEROL 0.5-2.5 (3) MG/3ML IN SOLN
3.0000 mL | Freq: Four times a day (QID) | RESPIRATORY_TRACT | Status: DC
  Administered 2016-05-25 – 2016-05-26 (×4): 3 mL via RESPIRATORY_TRACT
  Filled 2016-05-25 (×4): qty 3

## 2016-05-25 MED ORDER — FUROSEMIDE 10 MG/ML IJ SOLN
40.0000 mg | Freq: Once | INTRAMUSCULAR | Status: AC
  Administered 2016-05-25: 40 mg via INTRAVENOUS
  Filled 2016-05-25: qty 4

## 2016-05-25 MED ORDER — BUDESONIDE 0.25 MG/2ML IN SUSP
0.2500 mg | Freq: Two times a day (BID) | RESPIRATORY_TRACT | Status: DC
  Administered 2016-05-25 – 2016-05-26 (×3): 0.25 mg via RESPIRATORY_TRACT
  Filled 2016-05-25 (×4): qty 2

## 2016-05-25 NOTE — Progress Notes (Signed)
[employer] PROGRESS NOTE  Benjamin Kim WUJ:[id number] DOB: May 07, 1932 DOA: 05/23/2016 PCP: Lauro RegulusANDERSON,MARSHALL W., MD  HPI/Subjective: Patient wheezing today. Some slight shortness of breath. As per nursing staff no further seizures. Patient not complaining of tongue pain today.  Objective: Vitals:   05/25/16 0755 05/25/16 1453  BP: 125/70 (!) 114/58  Pulse: 77 79  Resp:  16  Temp:  98.3 F (36.8 C)    Filed Weights   05/23/16 2005 05/24/16 0135  Weight: 59.9 kg (132 lb) 61.5 kg (135 lb 9.6 oz)    ROS: Review of Systems  Constitutional: Negative for chills and fever.  Eyes: Negative for blurred vision.  Respiratory: Negative for cough and shortness of breath.   Cardiovascular: Negative for chest pain.  Gastrointestinal: Negative for abdominal pain, constipation, diarrhea, nausea and vomiting.  Genitourinary: Negative for dysuria.  Musculoskeletal: Negative for joint pain.  Neurological: Negative for dizziness and headaches.   Exam: Physical Exam  HENT:  Nose: No mucosal edema.  Mouth/Throat: No oropharyngeal exudate or posterior oropharyngeal edema.  Tongue bite left lateral tongue. Also bite on lip.  Eyes: Conjunctivae, EOM and lids are normal. Pupils are equal, round, and reactive to light.  Neck: No JVD present. Carotid bruit is not present. No edema present. No thyroid mass and no thyromegaly present.  Cardiovascular: S1 normal and S2 normal.  Exam reveals no gallop.   No murmur heard. Pulses:      Dorsalis pedis pulses are 2+ on the right side, and 2+ on the left side.  Respiratory: No respiratory distress. He has no wheezes. He has no rhonchi. He has no rales.  GI: Soft. Bowel sounds are normal. There is no tenderness.  Musculoskeletal:       Right ankle: He exhibits no swelling.       Left ankle: He exhibits no swelling.  Lymphadenopathy:    He has no cervical adenopathy.  Neurological: He is alert. No cranial nerve deficit.  Able to lift each leg up  off the bed. Able to lift each arm off the bed.  Skin: Skin is warm. No rash noted. Nails show no clubbing.  Psychiatric: He has a normal mood and affect.      Data Reviewed: Basic Metabolic Panel:  Recent Labs Lab 05/23/16 2031 05/24/16 0426  NA 138 138  K 4.3 3.5  CL 106 109  CO2 26 21*  GLUCOSE 115* 118*  BUN 24* 21*  CREATININE 1.53* 1.12  CALCIUM 9.8 9.0   Liver Function Tests:  Recent Labs Lab 05/23/16 2031 05/24/16 0426  AST 36 23  ALT 19 17  ALKPHOS 71 62  BILITOT 0.4 0.7  PROT 6.6 5.9*  ALBUMIN 3.7 3.2*    Recent Labs Lab 05/23/16 2031  LIPASE 24   CBC:  Recent Labs Lab 05/23/16 2031 05/24/16 0426  WBC 8.1 9.8  NEUTROABS 6.3  --   HGB 12.3* 11.7*  HCT 37.3* 34.4*  MCV 88.2 87.0  PLT 280 250   Cardiac Enzymes:  Recent Labs Lab 05/23/16 2031  TROPONINI <0.03    CBG:  Recent Labs Lab 05/24/16 0726  GLUCAP 117*    Recent Results (from the past 240 hour(s))  MRSA PCR Screening     Status: None   Collection Time: 05/24/16 12:07 PM  Result Value Ref Range Status   MRSA by PCR NEGATIVE NEGATIVE Final    Comment:        The GeneXpert MRSA Assay (FDA approved for NASAL specimens only), is  one component of a comprehensive MRSA colonization surveillance program. It is not intended to diagnose MRSA infection nor to guide or monitor treatment for MRSA infections.      Studies: Dg Chest 1 View  Result Date: 05/25/2016 CLINICAL DATA:  Wheezing. EXAM: CHEST 1 VIEW COMPARISON:  Radiograph of May 23, 2016. FINDINGS: The heart size and mediastinal contours are within normal limits. Both lungs are clear. Atherosclerosis of thoracic aorta is noted. Moderate size sliding hiatal hernia is noted. No pneumothorax or pleural effusion is noted. The visualized skeletal structures are unremarkable. IMPRESSION: Moderate size hiatal hernia. Aortic atherosclerosis. No acute cardiopulmonary abnormality seen. Electronically Signed   By: Lupita Raider, M.D.   On: 05/25/2016 12:32   Ct Head Wo Contrast  Result Date: 05/23/2016 CLINICAL DATA:  Witnessed syncopal episode while sitting on bed. History of dementia, hypertension and hypercholesterolemia. EXAM: CT HEAD WITHOUT CONTRAST TECHNIQUE: Contiguous axial images were obtained from the base of the skull through the vertex without intravenous contrast. COMPARISON:  CT HEAD July 12, 2015 FINDINGS: INTRACRANIAL CONTENTS: The ventricles and sulci are normal for age. No intraparenchymal hemorrhage, mass effect nor midline shift. Patchy to confluent supratentorial white matter hypodensities are similar. RIGHT inferior basal ganglia perivascular space. Old LEFT basal ganglia lacunar infarct. No acute large vascular territory infarcts. No abnormal extra-axial fluid collections. Basal cisterns are patent. Mild calcific atherosclerosis of the carotid siphons. ORBITS: The included ocular globes and orbital contents are non-suspicious. Status post bilateral ocular lens implants. SINUSES: The mastoid aircells and included paranasal sinuses are well-aerated. SKULL/SOFT TISSUES: No skull fracture. No significant soft tissue swelling. RIGHT occipital scalp lipoma. IMPRESSION: No acute intracranial process. Stable involutional changes and moderate to severe chronic small vessel ischemic disease. Old LEFT basal ganglia lacunar infarct. Electronically Signed   By: Awilda Metro M.D.   On: 05/23/2016 22:26   Mr Laqueta Jean AV Contrast  Result Date: 05/24/2016 CLINICAL DATA:  Seizure.  Dementia. EXAM: MRI HEAD WITHOUT AND WITH CONTRAST TECHNIQUE: Multiplanar, multiecho pulse sequences of the brain and surrounding structures were obtained without and with intravenous contrast. CONTRAST:  10mL MULTIHANCE GADOBENATE DIMEGLUMINE 529 MG/ML IV SOLN COMPARISON:  CT head 05/23/2016 FINDINGS: Image quality degraded by motion Moderate atrophy.  Negative for hydrocephalus. Negative for acute infarct. Moderate chronic microvascular  ischemic change throughout the cerebral white matter. Chronic infarct in the right pons. Chronic infarct left basal ganglia. Negative for intracranial hemorrhage.  No fluid collection. Negative for mass or edema.  No shift of the midline structures. Postcontrast images degraded by motion. Allowing for this, no enhancing mass lesion identified. Normal venous enhancement. Circle of Willis patent. Paranasal sinuses clear. Bilateral cataract extraction. Pituitary normal in size. IMPRESSION: Moderate atrophy and moderate chronic microvascular ischemia. No acute abnormality. Electronically Signed   By: Marlan Palau M.D.   On: 05/24/2016 10:03   Dg Chest Port 1 View  Result Date: 05/23/2016 CLINICAL DATA:  Patient lives in a nursing facility. Witnessed syncopal episode while sitting on a bed. EXAM: PORTABLE CHEST 1 VIEW COMPARISON:  03/06/2015, 07/14/2015 FINDINGS: Heart size is upper normal. Large hiatal hernia is present. There are no focal consolidations or pleural effusions. No pulmonary edema. IMPRESSION: No evidence for acute  abnormality.  Hiatal hernia. Electronically Signed   By: Norva Pavlov M.D.   On: 05/23/2016 20:58    Scheduled Meds: . budesonide (PULMICORT) nebulizer solution  0.25 mg Nebulization BID  . enoxaparin (LOVENOX) injection  40 mg Subcutaneous Q24H  . feeding  supplement  1 Container Oral BID WC  . ferrous sulfate  325 mg Oral BID WC  . ipratropium-albuterol  3 mL Nebulization Q6H  . lacosamide  50 mg Oral BID  . LORazepam  1 mg Oral BID  . metoprolol succinate  25 mg Oral Daily  . pantoprazole  40 mg Oral BID AC  . QUEtiapine  25 mg Oral QHS  . senna-docusate  2 tablet Oral BID  . sodium chloride flush  3 mL Intravenous Q12H  . sucralfate  1 g Oral TID WC & HS  . venlafaxine XR  150 mg Oral Q breakfast       Assessment/Plan:  1. Wheezing. Chest x-ray did not show fluid overload but I stop fluids and give a dose of Lasix. I started budesonide and DuoNeb nebulizer  treatments to improve wheeze prior to disposition potentially tomorrow.  2. Repeated seizures. MRI of the brain negative for mass or stroke. EEG showing slowing. Neurology started on Vimpat 50 mg twice a day. Wellbutrin stopped because can lower the patient's threshold for seizure. Viscous lidocaine to tongue. 3. Acute kidney injury. Creatinine improved. 4. Essential hypertension. On Toprol-XL 5. Dementia. Start Seroquel at night and discontinue Remeron. Patient will go to a memory care unit potentially tomorrow 6. Weakness. Appreciate PT consultation  Code Status:     Code Status Orders        Start     Ordered   05/24/16 0143  Full code  Continuous     05/24/16 0143    Code Status History    Date Active Date Inactive Code Status Order ID Comments User Context   07/12/2015 11:24 AM 07/16/2015  7:21 PM DNR [id number]  Gale Journey, MD ED   07/12/2015  9:31 AM 07/12/2015 11:24 AM Full Code [id number]  Milagros Loll, MD ED     Family Communication: Wife at the bedside Disposition Plan: Potential disposition tomorrow  Consultants:  Neurology  Time spent: 25 minutes  Alford Highland  [employer]

## 2016-05-25 NOTE — Progress Notes (Signed)
Subjective: No further seizure activity noted.  Patient more alert and cooperative today.    Objective: Current vital signs: BP 125/70   Pulse 77   Temp 98.1 F (36.7 C) (Axillary)   Resp 18   Ht 5\' 8"  (1.727 m)   Wt 61.5 kg (135 lb 9.6 oz)   SpO2 97%   BMI 20.62 kg/m  Vital signs in last 24 hours: Temp:  [98.1 F (36.7 C)-99.6 F (37.6 C)] 98.1 F (36.7 C) (08/09 0419) Pulse Rate:  [74-92] 77 (08/09 0755) Resp:  [18] 18 (08/09 0419) BP: (115-135)/(44-72) 125/70 (08/09 0755) SpO2:  [96 %-100 %] 97 % (08/09 0755)  Intake/Output from previous day: 08/08 0701 - 08/09 0700 In: 360 [P.O.:360] Out: 200 [Urine:200] Intake/Output this shift: No intake/output data recorded. Nutritional status: Diet Heart Room service appropriate? Yes; Fluid consistency: Thin  Neurologic Exam: Mental Status: Alert and awake. Speech fluent.  Does not follow commands.   Cranial Nerves: II: Discs flat bilaterally; Blinks to bilateral confrontation.  Pupils equal, round, reactive to light and accommodation III,IV, VI: ptosis not present, extra-ocular motions grossly intact  V,VII: decreased right NLF VIII: hearing normal bilaterally IX,X: gag reflex present XI: bilateral shoulder shrug XII: unable to test Motor: Moves all extremities spontaneously.  Unable to perform formal testing but no focal weakness appreciated.   Sensory: Responds to noxious stimuli throughout Deep Tendon Reflexes: 2+ and symmetric with absent AJ's Plantars: Right: downgoing                                                              Left: downgoing Cerebellar: Unable to perform Gait: Requires significant cues with walker  Lab Results: Basic Metabolic Panel:  Recent Labs Lab 05/23/16 2031 05/24/16 0426  NA 138 138  K 4.3 3.5  CL 106 109  CO2 26 21*  GLUCOSE 115* 118*  BUN 24* 21*  CREATININE 1.53* 1.12  CALCIUM 9.8 9.0    Liver Function Tests:  Recent Labs Lab 05/23/16 2031 05/24/16 0426  AST 36 23   ALT 19 17  ALKPHOS 71 62  BILITOT 0.4 0.7  PROT 6.6 5.9*  ALBUMIN 3.7 3.2*    Recent Labs Lab 05/23/16 2031  LIPASE 24   No results for input(s): AMMONIA in the last 168 hours.  CBC:  Recent Labs Lab 05/23/16 2031 05/24/16 0426  WBC 8.1 9.8  NEUTROABS 6.3  --   HGB 12.3* 11.7*  HCT 37.3* 34.4*  MCV 88.2 87.0  PLT 280 250    Cardiac Enzymes:  Recent Labs Lab 05/23/16 2031  TROPONINI <0.03    Lipid Panel: No results for input(s): CHOL, TRIG, HDL, CHOLHDL, VLDL, LDLCALC in the last 168 hours.  CBG:  Recent Labs Lab 05/24/16 0726  GLUCAP 117*    Microbiology: Results for orders placed or performed during the hospital encounter of 05/23/16  MRSA PCR Screening     Status: None   Collection Time: 05/24/16 12:07 PM  Result Value Ref Range Status   MRSA by PCR NEGATIVE NEGATIVE Final    Comment:        The GeneXpert MRSA Assay (FDA approved for NASAL specimens only), is one component of a comprehensive MRSA colonization surveillance program. It is not intended to diagnose MRSA infection nor to guide or  monitor treatment for MRSA infections.     Coagulation Studies: No results for input(s): LABPROT, INR in the last 72 hours.  Imaging: Ct Head Wo Contrast  Result Date: 05/23/2016 CLINICAL DATA:  Witnessed syncopal episode while sitting on bed. History of dementia, hypertension and hypercholesterolemia. EXAM: CT HEAD WITHOUT CONTRAST TECHNIQUE: Contiguous axial images were obtained from the base of the skull through the vertex without intravenous contrast. COMPARISON:  CT HEAD July 12, 2015 FINDINGS: INTRACRANIAL CONTENTS: The ventricles and sulci are normal for age. No intraparenchymal hemorrhage, mass effect nor midline shift. Patchy to confluent supratentorial white matter hypodensities are similar. RIGHT inferior basal ganglia perivascular space. Old LEFT basal ganglia lacunar infarct. No acute large vascular territory infarcts. No abnormal  extra-axial fluid collections. Basal cisterns are patent. Mild calcific atherosclerosis of the carotid siphons. ORBITS: The included ocular globes and orbital contents are non-suspicious. Status post bilateral ocular lens implants. SINUSES: The mastoid aircells and included paranasal sinuses are well-aerated. SKULL/SOFT TISSUES: No skull fracture. No significant soft tissue swelling. RIGHT occipital scalp lipoma. IMPRESSION: No acute intracranial process. Stable involutional changes and moderate to severe chronic small vessel ischemic disease. Old LEFT basal ganglia lacunar infarct. Electronically Signed   By: Awilda Metroourtnay  Bloomer M.D.   On: 05/23/2016 22:26   Mr Laqueta JeanBrain W ZOWo Contrast  Result Date: 05/24/2016 CLINICAL DATA:  Seizure.  Dementia. EXAM: MRI HEAD WITHOUT AND WITH CONTRAST TECHNIQUE: Multiplanar, multiecho pulse sequences of the brain and surrounding structures were obtained without and with intravenous contrast. CONTRAST:  10mL MULTIHANCE GADOBENATE DIMEGLUMINE 529 MG/ML IV SOLN COMPARISON:  CT head 05/23/2016 FINDINGS: Image quality degraded by motion Moderate atrophy.  Negative for hydrocephalus. Negative for acute infarct. Moderate chronic microvascular ischemic change throughout the cerebral white matter. Chronic infarct in the right pons. Chronic infarct left basal ganglia. Negative for intracranial hemorrhage.  No fluid collection. Negative for mass or edema.  No shift of the midline structures. Postcontrast images degraded by motion. Allowing for this, no enhancing mass lesion identified. Normal venous enhancement. Circle of Willis patent. Paranasal sinuses clear. Bilateral cataract extraction. Pituitary normal in size. IMPRESSION: Moderate atrophy and moderate chronic microvascular ischemia. No acute abnormality. Electronically Signed   By: Marlan Palauharles  Clark M.D.   On: 05/24/2016 10:03   Dg Chest Port 1 View  Result Date: 05/23/2016 CLINICAL DATA:  Patient lives in a nursing facility. Witnessed  syncopal episode while sitting on a bed. EXAM: PORTABLE CHEST 1 VIEW COMPARISON:  03/06/2015, 07/14/2015 FINDINGS: Heart size is upper normal. Large hiatal hernia is present. There are no focal consolidations or pleural effusions. No pulmonary edema. IMPRESSION: No evidence for acute  abnormality.  Hiatal hernia. Electronically Signed   By: Norva PavlovElizabeth  Brown M.D.   On: 05/23/2016 20:58    Medications:  I have reviewed the patient's current medications. Scheduled: . budesonide (PULMICORT) nebulizer solution  0.25 mg Nebulization BID  . enoxaparin (LOVENOX) injection  40 mg Subcutaneous Q24H  . feeding supplement  1 Container Oral BID WC  . ferrous sulfate  325 mg Oral BID WC  . ipratropium-albuterol  3 mL Nebulization Q6H  . lacosamide  50 mg Oral BID  . LORazepam  1 mg Oral BID  . metoprolol succinate  25 mg Oral Daily  . pantoprazole  40 mg Oral BID AC  . QUEtiapine  25 mg Oral QHS  . senna-docusate  2 tablet Oral BID  . sodium chloride flush  3 mL Intravenous Q12H  . sucralfate  1 g Oral TID  WC & HS  . venlafaxine XR  150 mg Oral Q breakfast    Assessment/Plan: Patient alert.  Tolerating Vimpat.  No further seizure activity noted.  EEG shows left frontal sharp activity.  Wellbutrin discontinued  Recommendations: 1.  Agree with current management   LOS: 1 day   Thana Farr, MD Neurology [phone] 05/25/2016  11:58 AM

## 2016-05-25 NOTE — Evaluation (Signed)
Physical Therapy Evaluation Patient Details Name: EDI GORNIAK MRN: [id number] DOB: 01/23/1932 Today's Date: 05/25/2016   History of Present Illness  Ocean Schildt is a 82 y.o. male with a known history of dementia, HTN, HLD, CKD was in a usual state of health until this afternoon when his son witnessed one minute of tonic clonic seizure activity.  Patient was not alert during the episode and returned to baseline (dementia/confusion) after the event.  He did bite his tongue but did not lose continence.  His wife states that this has happened several times before, most recently about one year ago.  He has not sought medical attention following any prior seizure activity.   Of note he has recently been moved to a dementia living facility which has been upsetting to him.    Clinical Impression  Per spouse pt currently at baseline with functional mobility.  Pt able to perform bed mobility with mod A, transfers with Min A/CGA, and ambulate with RW and Min A to guide RW which is at baseline for patient.  Plan is for pt to discharge back to memory care unit at [facility]. No further PT services identified at this time, order to be completed.  Please submit additional order for PT services if needs change.     Follow Up Recommendations No PT follow up    Equipment Recommendations  None recommended by PT    Recommendations for Other Services       Precautions / Restrictions Precautions Precautions: Fall Restrictions Weight Bearing Restrictions: No      Mobility  Bed Mobility Overal bed mobility: Needs Assistance Bed Mobility: Supine to Sit;Sit to Supine     Supine to sit: Mod assist Sit to supine: Mod assist      Transfers Overall transfer level: Needs assistance Equipment used: Rolling walker (2 wheeled) Transfers: Sit to/from Stand Sit to Stand: Min assist         General transfer comment: Max verbal and tactile cues for sequencing  Ambulation/Gait Ambulation/Gait  assistance: Min assist Ambulation Distance (Feet): 20 Feet Assistive device: Rolling walker (2 wheeled) Gait Pattern/deviations: Shuffle   Gait velocity interpretation: <1.8 ft/sec, indicative of risk for recurrent falls General Gait Details: Slow cadence with Min A to guide RW  Stairs            Wheelchair Mobility    Modified Rankin (Stroke Patients Only)       Balance Overall balance assessment: Needs assistance Sitting-balance support: Feet unsupported;Feet supported Sitting balance-Leahy Scale: Fair     Standing balance support: Bilateral upper extremity supported Standing balance-Leahy Scale: Fair                               Pertinent Vitals/Pain Pain Assessment: No/denies pain    Home Living Family/patient expects to be discharged to:: Assisted living               Home Equipment: Walker - 2 wheels      Prior Function Level of Independence: Needs assistance   Gait / Transfers Assistance Needed: Min A with amb limited distances with RW or HHA  ADL's / Homemaking Assistance Needed: Dependent  Comments: Severe dementia, A&O x 0     Hand Dominance        Extremity/Trunk Assessment   Upper Extremity Assessment: Defer to OT evaluation           Lower Extremity Assessment: Overall WFL for tasks  assessed         Communication   Communication: Other (comment) (Dementia)  Cognition Arousal/Alertness: Awake/alert Behavior During Therapy: WFL for tasks assessed/performed Overall Cognitive Status: History of cognitive impairments - at baseline                      General Comments      Exercises        Assessment/Plan    PT Assessment Patent does not need any further PT services  PT Diagnosis     PT Problem List    PT Treatment Interventions     PT Goals (Current goals can be found in the Care Plan section) Acute Rehab PT Goals Patient Stated Goal: [facility] PT Goal  Formulation: With family (Pt unable to participate in goal setting d/t cognition)    Frequency     Barriers to discharge        Co-evaluation               End of Session Equipment Utilized During Treatment: Gait belt Activity Tolerance: Patient tolerated treatment well Patient left: in bed;with nursing/sitter in room;with bed alarm set           Time: 0920-1000 PT Time Calculation (min) (ACUTE ONLY): 40 min   Charges:   PT Evaluation $PT Eval Low Complexity: 1 Procedure     PT G Codes:        Lynford Humphreyavid Scott Jourdain Guay, PT 05/25/2016, 12:05 PM

## 2016-05-25 NOTE — Progress Notes (Signed)
Per RN Case Manager patient will likely D/C tomorrow pending medical clearance. PT recommended no follow up. Clinical Child psychotherapistocial Worker (CSW) [facility] Claris CheMargaret and made her aware of above. Per Claris CheMargaret patient can return when stable. CSW will continue to follow and assist as needed.   B[employer] Hughes IncorporatedBailey Olen Eaves, LCSW [phone]

## 2016-05-26 LAB — BASIC METABOLIC PANEL
ANION GAP: 7 (ref 5–15)
BUN: 17 mg/dL (ref 6–20)
CALCIUM: 9.3 mg/dL (ref 8.9–10.3)
CO2: 28 mmol/L (ref 22–32)
Chloride: 103 mmol/L (ref 101–111)
Creatinine, Ser: 1.01 mg/dL (ref 0.61–1.24)
GFR calc non Af Amer: >60 mL/min (ref >60)
Glucose, Bld: 96 mg/dL (ref 65–99)
POTASSIUM: 3.1 mmol/L — AB (ref 3.5–5.1)
Sodium: 138 mmol/L (ref 135–145)

## 2016-05-26 LAB — CBC
HEMATOCRIT: 32.1 % — AB (ref 40.0–52.0)
HEMOGLOBIN: 11.1 g/dL — AB (ref 13.0–18.0)
MCH: 29.9 pg (ref 26.0–34.0)
MCHC: 34.5 g/dL (ref 32.0–36.0)
MCV: 86.8 fL (ref 80.0–100.0)
Platelets: 199 10*3/uL (ref 150–440)
RBC: 3.7 MIL/uL — AB (ref 4.40–5.90)
RDW: 13.3 % (ref 11.5–14.5)
WBC: 7.8 10*3/uL (ref 3.8–10.6)

## 2016-05-26 MED ORDER — IPRATROPIUM-ALBUTEROL 0.5-2.5 (3) MG/3ML IN SOLN: 3.0000 mL | Freq: Four times a day (QID) | RESPIRATORY_TRACT | 0 refills | Status: AC | PRN

## 2016-05-26 MED ORDER — LACOSAMIDE 50 MG PO TABS: 50.0000 mg | ORAL_TABLET | Freq: Two times a day (BID) | ORAL | 0 refills | Status: AC

## 2016-05-26 MED ORDER — CLINDAMYCIN HCL 300 MG PO CAPS
300.0000 mg | ORAL_CAPSULE | Freq: Three times a day (TID) | ORAL | Status: DC
  Administered 2016-05-26: 300 mg via ORAL
  Filled 2016-05-26 (×2): qty 1

## 2016-05-26 MED ORDER — LORAZEPAM 0.5 MG PO TABS: ORAL_TABLET | ORAL | 0 refills | Status: DC

## 2016-05-26 MED ORDER — QUETIAPINE FUMARATE 25 MG PO TABS: 25.0000 mg | ORAL_TABLET | Freq: Every day | ORAL | 0 refills | Status: AC

## 2016-05-26 MED ORDER — POTASSIUM CHLORIDE CRYS ER 15 MEQ PO TBCR: EXTENDED_RELEASE_TABLET | ORAL | 0 refills | Status: DC

## 2016-05-26 MED ORDER — VENLAFAXINE HCL ER 150 MG PO CP24: 150.0000 mg | ORAL_CAPSULE | Freq: Every day | ORAL | 0 refills | Status: AC

## 2016-05-26 MED ORDER — IPRATROPIUM-ALBUTEROL 0.5-2.5 (3) MG/3ML IN SOLN: 3.0000 mL | Freq: Four times a day (QID) | RESPIRATORY_TRACT | Status: DC | PRN

## 2016-05-26 MED ORDER — POTASSIUM CHLORIDE CRYS ER 20 MEQ PO TBCR
40.0000 meq | EXTENDED_RELEASE_TABLET | Freq: Once | ORAL | Status: AC
  Administered 2016-05-26: 40 meq via ORAL

## 2016-05-26 MED ORDER — CLINDAMYCIN HCL 300 MG PO CAPS: 300.0000 mg | ORAL_CAPSULE | Freq: Three times a day (TID) | ORAL | 0 refills | Status: DC

## 2016-05-26 NOTE — Discharge Summary (Signed)
[facility] [place] [facility]   PATIENT NAME: Benjamin Kim    MR#:  [id number]  DATE OF BIRTH:  [birth year]  DATE OF ADMISSION:  05/23/2016 ADMITTING PHYSICIAN: Delfino Lovett, MD  DATE OF DISCHARGE: 05/26/2016  PRIMARY CARE PHYSICIAN: Lauro Regulus., MD    ADMISSION DIAGNOSIS:  Syncope and collapse [R55] Syncope [R55] Elevated lactic acid level [E87.2]  DISCHARGE DIAGNOSIS:  Active Problems:   Seizure (HCC)   SECONDARY DIAGNOSIS:   Past Medical History:  Diagnosis Date  . Anxiety   . Asthma   . CKD (chronic kidney disease) stage 3, GFR 30-59 ml/min   . Dementia   . Depressed   . Hypercholesteremia   . Hypertension     HOSPITAL COURSE:   1. Repeat repeated seizures. MRI of the brain negative for mass or stroke. EEG showing slowing. Neurology started Vimpat 50 mg twice a day. Well groomed Laveda Norman stopped because that can lower the patient's threshold for seizure. During the hospital course I did give viscous lidocaine to tongue secondary to tongue bite. But since the patient is not complaining of 10 pain today and will stop that medication. 2. Wheezing and fever. I did give the patient and nebulizer treatments yesterday. Chest x-ray was negative for infection but since the patient spiked a fever last night I will give empiric clindamycin. Patient currently afebrile. Nebulizer prescribed. 3. Acute kidney injury creatinine had improved with IV fluid hydration. 4. History of GERD and ulcer. On iron for anemia. On Carafate and Protonix. 5. Dementia. Patient is at a memory care unit. I started Seroquel at night and discontinued Remeron and temazepam. 6. Weakness. Physical therapy stated patient did well enough to go back to the memory care unit. 7. Hypokalemia replace potassium orally for 5 days upon discharge   DISCHARGE CONDITIONS:   Fair  CONSULTS OBTAINED:  Treatment Team:  Thana Farr, MD  DRUG ALLERGIES:   Allergies  Allergen Reactions   . Ivp Dye [Iodinated Diagnostic Agents] Anaphylaxis    DISCHARGE MEDICATIONS:   Current Discharge Medication List    START taking these medications   Details  clindamycin (CLEOCIN) 300 MG capsule Take 1 capsule (300 mg total) by mouth every 8 (eight) hours. Qty: 20 capsule, Refills: 0    ipratropium-albuterol (DUONEB) 0.5-2.5 (3) MG/3ML SOLN Take 3 mLs by nebulization every 6 (six) hours as needed. Qty: 360 mL, Refills: 0    lacosamide (VIMPAT) 50 MG TABS tablet Take 1 tablet (50 mg total) by mouth 2 (two) times daily. Qty: 60 tablet, Refills: 0    potassium chloride SA (KLOR-CON M15) 15 MEQ tablet One dose daily for 5 days then stop Qty: 5 tablet, Refills: 0    QUEtiapine (SEROQUEL) 25 MG tablet Take 1 tablet (25 mg total) by mouth at bedtime. Qty: 30 tablet, Refills: 0      CONTINUE these medications which have CHANGED   Details  LORazepam (ATIVAN) 0.5 MG tablet Take one tablet by mouth twice daily at 1pm and every night at bedtime for anxiety Qty: 10 tablet, Refills: 0    venlafaxine XR (EFFEXOR-XR) 150 MG 24 hr capsule Take 1 capsule (150 mg total) by mouth daily with breakfast. Take two tablets by mouth once daily Qty: 30 capsule, Refills: 0      CONTINUE these medications which have NOT CHANGED   Details  feeding supplement (BOOST / RESOURCE BREEZE) LIQD Take 1 Container by mouth 2 (two) times daily with a meal. Refills: 0  ferrous sulfate 325 (65 FE) MG tablet Take 1 tablet (325 mg total) by mouth 2 (two) times daily with a meal. Refills: 3    pantoprazole (PROTONIX) 40 MG tablet Take 1 tablet (40 mg total) by mouth 2 (two) times daily before a meal. Qty: 30 tablet, Refills: 0    polyethylene glycol (MIRALAX / GLYCOLAX) packet Take 17 g by mouth daily as needed for moderate constipation or severe constipation. Qty: 14 each, Refills: 0    senna-docusate (SENOKOT-S) 8.6-50 MG tablet Take 2 tablets by mouth 2 (two) times daily.    sucralfate (CARAFATE) 1  GM/10ML suspension Take 10 mLs (1 g total) by mouth 4 (four) times daily -  with meals and at bedtime. Qty: 420 mL, Refills: 0      STOP taking these medications     buPROPion (WELLBUTRIN XL) 150 MG 24 hr tablet      metoprolol succinate (TOPROL-XL) 25 MG 24 hr tablet      mirtazapine (REMERON) 15 MG tablet          DISCHARGE INSTRUCTIONS:   Follow-up PMD in 1 week  If you experience worsening of your admission symptoms, develop shortness of breath, life threatening emergency, suicidal or homicidal thoughts you must seek medical attention immediately by calling 911 or calling your MD immediately  if symptoms less severe.  You Must read complete instructions/literature along with all the possible adverse reactions/side effects for all the Medicines you take and that have been prescribed to you. Take any new Medicines after you have completely understood and accept all the possible adverse reactions/side effects.   Please note  You were cared for by a hospitalist during your hospital stay. If you have any questions about your discharge medications or the care you received while you were in the hospital after you are discharged, you can call the unit and asked to speak with the hospitalist on call if the hospitalist that took care of you is not available. Once you are discharged, your primary care physician will handle any further medical issues. Please note that NO REFILLS for any discharge medications will be authorized once you are discharged, as it is imperative that you return to your primary care physician (or establish a relationship with a primary care physician if you do not have one) for your aftercare needs so that they can reassess your need for medications and monitor your lab values.    Today   CHIEF COMPLAINT:   Chief Complaint  Patient presents with  . Loss of Consciousness    HISTORY OF PRESENT ILLNESS:  Benjamin Kim  is a 82 y.o. male presented with  seizure.   VITAL SIGNS:  Blood pressure (!) 122/57, pulse 81, temperature 98.4 F (36.9 C), temperature source Oral, resp. rate 16, height 5\' 8"  (1.727 m), weight 61.5 kg (135 lb 9.6 oz), SpO2 95 %.   PHYSICAL EXAMINATION:  GENERAL:  82 y.o.-year-old patient lying in the bed with no acute distress.  EYES: Pupils equal, round, reactive to light and accommodation. No scleral icterus. Extraocular muscles intact.  HEENT: Head atraumatic, normocephalic. Oropharynx and nasopharynx clear.  NECK:  Supple, no jugular venous distention. No thyroid enlargement, no tenderness.  LUNGS: Decreased breath sounds bilaterally, no wheezing, rales,rhonchi or crepitation. No use of accessory muscles of respiration.  CARDIOVASCULAR: S1, S2 normal. No murmurs, rubs, or gallops.  ABDOMEN: Soft, non-tender, non-distended. Bowel sounds present. No organomegaly or mass.  EXTREMITIES: No pedal edema, cyanosis, or clubbing. Trigger finger left  third finger. NEUROLOGIC: Cranial nerves II through XII are intact. Muscle strength 5/5 in all extremities. Sensation intact. Gait not checked. Patient able to straight leg raise. PSYCHIATRIC: The patient is alert.  SKIN: No obvious rash, lesion, or ulcer.   DATA REVIEW:   CBC  Recent Labs Lab 05/26/16 0817  WBC 7.8  HGB 11.1*  HCT 32.1*  PLT 199    Chemistries   Recent Labs Lab 05/24/16 0426 05/26/16 0817  NA 138 138  K 3.5 3.1*  CL 109 103  CO2 21* 28  GLUCOSE 118* 96  BUN 21* 17  CREATININE 1.12 1.01  CALCIUM 9.0 9.3  AST 23  --   ALT 17  --   ALKPHOS 62  --   BILITOT 0.7  --     Cardiac Enzymes  Recent Labs Lab 05/23/16 2031  TROPONINI <0.03    Microbiology Results  Results for orders placed or performed during the hospital encounter of 05/23/16  MRSA PCR Screening     Status: None   Collection Time: 05/24/16 12:07 PM  Result Value Ref Range Status   MRSA by PCR NEGATIVE NEGATIVE Final    Comment:        The GeneXpert MRSA Assay  (FDA approved for NASAL specimens only), is one component of a comprehensive MRSA colonization surveillance program. It is not intended to diagnose MRSA infection nor to guide or monitor treatment for MRSA infections.     RADIOLOGY:  Dg Chest 1 View  Result Date: 05/25/2016 CLINICAL DATA:  Wheezing. EXAM: CHEST 1 VIEW COMPARISON:  Radiograph of May 23, 2016. FINDINGS: The heart size and mediastinal contours are within normal limits. Both lungs are clear. Atherosclerosis of thoracic aorta is noted. Moderate size sliding hiatal hernia is noted. No pneumothorax or pleural effusion is noted. The visualized skeletal structures are unremarkable. IMPRESSION: Moderate size hiatal hernia. Aortic atherosclerosis. No acute cardiopulmonary abnormality seen. Electronically Signed   By: Lupita Raider, M.D.   On: 05/25/2016 12:32    Management plans discussed with the patient, family and they are in agreement.  CODE STATUS:     Code Status Orders        Start     Ordered   05/24/16 0143  Full code  Continuous     05/24/16 0143    Code Status History    Date Active Date Inactive Code Status Order ID Comments User Context   07/12/2015 11:24 AM 07/16/2015  7:21 PM DNR [id number]  Gale Journey, MD ED   07/12/2015  9:31 AM 07/12/2015 11:24 AM Full Code [id number]  Milagros Loll, MD ED      TOTAL TIME TAKING CARE OF THIS PATIENT: 35 minutes.    Alford Highland M.D on 05/26/2016 at 10:24 AM  Between 7am to 6pm - Pager - [phone]  After 6pm go to [email or web address] - password [email or web address]  [employer] Office  [phone]  CC: Primary care physician; Lauro Regulus., MD

## 2016-05-26 NOTE — Progress Notes (Signed)
Patient is medically stable [facility] can [facility] today and they can do the nebulizer treatments. [facility] rep delivered nebulizer to room and patient's [facility] will [facility]. Clinical Child psychotherapistocial Worker (CSW) sent [facility]. Patient's wife is at bedside and aware of above. Per wife she will transport patient today. Please reconsult if future social work needs arise. CSW signing off.   [employer] IncorporatedBailey Nicolaus Andel, LCSW [phone]

## 2016-05-26 NOTE — NC FL2 (Signed)
[employer] MEDICAID FL2 LEVEL OF CARE SCREENING TOOL     IDENTIFICATION  Patient Name: Benjamin Kim Birthdate: 1932/02/06 Sex: male Admission Date (Current Location): 05/23/2016  [place] and [place] Number:  Chiropodist and Address:  [facility], [address], [place], [place] [address]      Provider Number: [id number]  Attending Physician Name and Address:  Alford Highland, MD  Relative Name and Phone Number:       Current Level of Care: Hospital Recommended Level of Care: Assisted Living Facility, Memory Care Prior Approval Number:    Date Approved/Denied:   PASRR Number:  [id number])  Discharge Plan: Domiciliary (Rest home)    Current Diagnoses: Primary: Dementia  Patient Active Problem List   Diagnosis Date Noted  . Seizure (HCC) 05/24/2016  . Protein-calorie malnutrition, severe (HCC) 07/14/2015  . Dementia with behavioral disturbance 07/13/2015  . Depression 07/13/2015  . Anemia 07/12/2015  . Gastrointestinal bleeding 07/12/2015    Orientation RESPIRATION BLADDER Height & Weight     Self  Normal Incontinent Weight: 135 lb 9.6 oz (61.5 kg) Height:   (172.7 cm)  BEHAVIORAL SYMPTOMS/MOOD NEUROLOGICAL BOWEL NUTRITION STATUS   (none ) Convulsions/Seizures (History of Seizures) Continent Diet (Diet: Heart Healthy )  AMBULATORY STATUS COMMUNICATION OF NEEDS Skin   Supervision Verbally Normal                       Personal Care Assistance Level of Assistance  Bathing, Feeding, Dressing Bathing Assistance: Limited assistance Feeding assistance: Limited assistance Dressing Assistance: Limited assistance     Functional Limitations Info  Sight, Hearing, Speech Sight Info: Adequate Hearing Info: Adequate Speech Info: Adequate    SPECIAL CARE FACTORS FREQUENCY                       Contractures      Additional Factors Info  Code Status, Allergies Code Status Info:  (Full Code) Allergies Info:   (Ivp Dye Iodinated Diagnostic Agents)          Discharge Medications: Please see discharge summary for a list of discharge medications. DISCHARGE MEDICATIONS:       Current Discharge Medication List        START taking these medications   Details  clindamycin (CLEOCIN) 300 MG capsule Take 1 capsule (300 mg total) by mouth every 8 (eight) hours. Qty: 20 capsule, Refills: 0    ipratropium-albuterol (DUONEB) 0.5-2.5 (3) MG/3ML SOLN Take 3 mLs by nebulization every 6 (six) hours as needed. Qty: 360 mL, Refills: 0    lacosamide (VIMPAT) 50 MG TABS tablet Take 1 tablet (50 mg total) by mouth 2 (two) times daily. Qty: 60 tablet, Refills: 0    potassium chloride SA (KLOR-CON M15) 15 MEQ tablet One dose daily for 5 days then stop Qty: 5 tablet, Refills: 0    QUEtiapine (SEROQUEL) 25 MG tablet Take 1 tablet (25 mg total) by mouth at bedtime. Qty: 30 tablet, Refills: 0          CONTINUE these medications which have CHANGED   Details  LORazepam (ATIVAN) 0.5 MG tablet Take one tablet by mouth twice daily at 1pm and every night at bedtime for anxiety Qty: 10 tablet, Refills: 0    venlafaxine XR (EFFEXOR-XR) 150 MG 24 hr capsule Take 1 capsule (150 mg total) by mouth daily with breakfast. Take two tablets by mouth once daily Qty: 30 capsule, Refills: 0  CONTINUE these medications which have NOT CHANGED   Details  feeding supplement (BOOST / RESOURCE BREEZE) LIQD Take 1 Container by mouth 2 (two) times daily with a meal. Refills: 0    ferrous sulfate 325 (65 FE) MG tablet Take 1 tablet (325 mg total) by mouth 2 (two) times daily with a meal. Refills: 3    pantoprazole (PROTONIX) 40 MG tablet Take 1 tablet (40 mg total) by mouth 2 (two) times daily before a meal. Qty: 30 tablet, Refills: 0    polyethylene glycol (MIRALAX / GLYCOLAX) packet Take 17 g by mouth daily as needed for moderate constipation or severe constipation. Qty: 14 each, Refills: 0     senna-docusate (SENOKOT-S) 8.6-50 MG tablet Take 2 tablets by mouth 2 (two) times daily.    sucralfate (CARAFATE) 1 GM/10ML suspension Take 10 mLs (1 g total) by mouth 4 (four) times daily -  with meals and at bedtime. Qty: 420 mL, Refills: 0         STOP taking these medications     buPROPion (WELLBUTRIN XL) 150 MG 24 hr tablet      metoprolol succinate (TOPROL-XL) 25 MG 24 hr tablet      mirtazapine (REMERON) 15 MG tablet       Relevant Imaging Results: Relevant Lab Results: Additional Information  (SSN: [id number])  Jeury Mcnab, Darleen CrockerBailey M, LCSW

## 2016-05-26 NOTE — Care Management Important Message (Signed)
Important Message  Patient Details  Name: Benjamin Kim MRN: [id number] Date of Birth: 08-03-1932   Medicare Important Message Given:  Yes    Adonis HugueninBerkhead, Donnette Macmullen L, RN 05/26/2016, 10:47 AM

## 2016-05-26 NOTE — Progress Notes (Signed)
Checked to see if pre-authorization would be needed for non-emergent EMS transport. Per [employer] benefits obtained online through [employer], patient has a [employer] Medicare Advantage PPO policy.  Medicare PPO plans do not require pre-auth for non-emergent ground transports using service codes A0426 or A0428.   

## 2016-05-26 NOTE — Care Management (Signed)
Nebul[facility] Prior to [facility]. [facility] CSW following.

## 2016-06-24 ENCOUNTER — Emergency Department
Admission: EM | Admit: 2016-06-24 | Discharge: 2016-06-24 | Disposition: A | Discharge status: Home / Self Care | Payer: Medicare Other | Attending: Emergency Medicine | Admitting: Emergency Medicine

## 2016-06-24 ENCOUNTER — Emergency Department: Payer: Medicare Other

## 2016-06-24 DIAGNOSIS — J45909 Unspecified asthma, uncomplicated: Secondary | ICD-10-CM | POA: Insufficient documentation

## 2016-06-24 DIAGNOSIS — Y939 Activity, unspecified: Secondary | ICD-10-CM | POA: Insufficient documentation

## 2016-06-24 DIAGNOSIS — N183 Chronic kidney disease, stage 3 (moderate): Secondary | ICD-10-CM | POA: Insufficient documentation

## 2016-06-24 DIAGNOSIS — Z23 Encounter for immunization: Secondary | ICD-10-CM | POA: Insufficient documentation

## 2016-06-24 DIAGNOSIS — T148XXA Other injury of unspecified body region, initial encounter: Secondary | ICD-10-CM

## 2016-06-24 DIAGNOSIS — W1839XA Other fall on same level, initial encounter: Secondary | ICD-10-CM | POA: Diagnosis not present

## 2016-06-24 DIAGNOSIS — Y999 Unspecified external cause status: Secondary | ICD-10-CM | POA: Insufficient documentation

## 2016-06-24 DIAGNOSIS — S0990XA Unspecified injury of head, initial encounter: Secondary | ICD-10-CM | POA: Insufficient documentation

## 2016-06-24 DIAGNOSIS — Z79899 Other long term (current) drug therapy: Secondary | ICD-10-CM | POA: Insufficient documentation

## 2016-06-24 DIAGNOSIS — W19XXXA Unspecified fall, initial encounter: Secondary | ICD-10-CM

## 2016-06-24 DIAGNOSIS — I129 Hypertensive chronic kidney disease with stage 1 through stage 4 chronic kidney disease, or unspecified chronic kidney disease: Secondary | ICD-10-CM | POA: Insufficient documentation

## 2016-06-24 DIAGNOSIS — Y9289 Other specified places as the place of occurrence of the external cause: Secondary | ICD-10-CM | POA: Insufficient documentation

## 2016-06-24 MED ORDER — ACETAMINOPHEN 500 MG PO TABS
1000.0000 mg | ORAL_TABLET | Freq: Once | ORAL | Status: AC
  Administered 2016-06-24: 1000 mg via ORAL
  Filled 2016-06-24: qty 2

## 2016-06-24 MED ORDER — TETANUS-DIPHTH-ACELL PERTUSSIS 5-2.5-18.5 LF-MCG/0.5 IM SUSP
0.5000 mL | Freq: Once | INTRAMUSCULAR | Status: AC
  Administered 2016-06-24: 0.5 mL via INTRAMUSCULAR
  Filled 2016-06-24: qty 0.5

## 2016-06-24 NOTE — ED Notes (Signed)
Pt and Pt's wife verbalized understanding of discharge instructions. NAD at this time.

## 2016-06-24 NOTE — ED Notes (Signed)
Patient transported to CT 

## 2016-06-24 NOTE — ED Notes (Signed)
MD McShane at bedside  

## 2016-06-24 NOTE — ED Provider Notes (Addendum)
[facility] Emergency Department Provider Note  ____________________________________________   I have reviewed the triage vital signs and the nursing notes.   HISTORY  Chief Complaint I tripped   HPI Benjamin Kim is a 82 y.o. male suffered a non-syncopal fall. He remembers falling. He bumped to his head. Did not have any seizure activity or loss of consciousness.He is at baseline according to his wife who is at bedside. Report initially suggest the patient was on blood thinners but the patient is not, MAR does not. Family states that he is not taking anything like that. The patient is a work and alert and oriented denies headache or any other complaint. States only that he lost his balance. He did hit his head. He denies hip pain or any other complaint. There was no reported seizure activity and he is compliant with his medications.      Past Medical History:  Diagnosis Date  . Anxiety   . Asthma   . CKD (chronic kidney disease) stage 3, GFR 30-59 ml/min   . Dementia   . Depressed   . Hypercholesteremia   . Hypertension     Patient Active Problem List   Diagnosis Date Noted  . Seizure (HCC) 05/24/2016  . Protein-calorie malnutrition, severe (HCC) 07/14/2015  . Dementia with behavioral disturbance 07/13/2015  . Depression 07/13/2015  . Anemia 07/12/2015  . Gastrointestinal bleeding 07/12/2015    Past Surgical History:  Procedure Laterality Date  . PROSTATE SURGERY      Prior to Admission medications   Medication Sig Start Date End Date Taking? Authorizing Provider  feeding supplement (BOOST / RESOURCE BREEZE) LIQD Take 1 Container by mouth 2 (two) times daily with a meal. 07/15/15  Yes Srikar Sudini, MD  fenofibrate 160 MG tablet Take 160 mg by mouth every evening.   Yes Historical Provider, MD  ferrous sulfate 325 (65 FE) MG tablet Take 1 tablet (325 mg total) by mouth 2 (two) times daily with a meal. Patient taking differently: Take 325  mg by mouth 2 (two) times daily before a meal.  07/15/15  Yes Srikar Sudini, MD  ipratropium-albuterol (DUONEB) 0.5-2.5 (3) MG/3ML SOLN Take 3 mLs by nebulization every 6 (six) hours as needed. Patient taking differently: Take 3 mLs by nebulization every 6 (six) hours as needed. For shortness of breath or wheezing 05/26/16  Yes Alford Highlandichard Wieting, MD  lacosamide (VIMPAT) 50 MG TABS tablet Take 1 tablet (50 mg total) by mouth 2 (two) times daily. 05/26/16  Yes Richard Renae GlossWieting, MD  LORazepam (ATIVAN) 1 MG tablet Take 0.5 mg by mouth 2 (two) times daily.   Yes Historical Provider, MD  pantoprazole (PROTONIX) 40 MG tablet Take 1 tablet (40 mg total) by mouth 2 (two) times daily before a meal. 07/15/15  Yes Srikar Sudini, MD  polyethylene glycol (MIRALAX / GLYCOLAX) packet Take 17 g by mouth daily as needed for moderate constipation or severe constipation. 07/15/15  Yes Srikar Sudini, MD  QUEtiapine (SEROQUEL) 25 MG tablet Take 1 tablet (25 mg total) by mouth at bedtime. 05/26/16  Yes Richard Renae GlossWieting, MD  senna-docusate (SENOKOT-S) 8.6-50 MG tablet Take 2 tablets by mouth 2 (two) times daily. 07/15/15  Yes Srikar Sudini, MD  sucralfate (CARAFATE) 1 GM/10ML suspension Take 10 mLs (1 g total) by mouth 4 (four) times daily -  with meals and at bedtime. Patient taking differently: Take 1 g by mouth 4 (four) times daily -  before meals and at bedtime.  07/15/15  Yes  Milagros Loll, MD  venlafaxine XR (EFFEXOR-XR) 150 MG 24 hr capsule Take 1 capsule (150 mg total) by mouth daily with breakfast. Take two tablets by mouth once daily Patient taking differently: Take 300 mg by mouth daily with breakfast.  05/26/16  Yes Alford Highland, MD  clindamycin (CLEOCIN) 300 MG capsule Take 1 capsule (300 mg total) by mouth every 8 (eight) hours. Patient not taking: Reported on 06/24/2016 05/26/16   Alford Highland, MD    Allergies Ivp dye [iodinated diagnostic agents]  Family History  Problem Relation Age of Onset  . Lung cancer  Father     Social History Social History  Substance Use Topics  . Smoking status: Never Smoker  . Smokeless tobacco: Never Used  . Alcohol use No    Review of Systems Constitutional: No fever/chills Eyes: No visual changes. ENT: No sore throat. No stiff neck no neck pain Cardiovascular: Denies chest pain. Respiratory: Denies shortness of breath. Gastrointestinal:   no vomiting.  No diarrhea.  No constipation. Genitourinary: Negative for dysuria. Musculoskeletal: Negative lower extremity swelling Skin: Negative for rash. Neurological: Negative for severe headaches, focal weakness or numbness. 10-point ROS otherwise negative.  ____________________________________________   PHYSICAL EXAM:  VITAL SIGNS: ED Triage Vitals  Enc Vitals Group     BP 06/24/16 0700 (!) 143/75     Pulse Rate 06/24/16 0702 82     Resp --      Temp 06/24/16 0700 98.8 F (37.1 C)     Temp Source 06/24/16 0700 Oral     SpO2 06/24/16 0702 100 %     Weight 06/24/16 0701 145 lb (65.8 kg)     Height 06/24/16 0701 5\' 9"  (1.753 m)     Head Circumference --      Peak Flow --      Pain Score --      Pain Loc --      Pain Edu? --      Excl. in GC? --     Constitutional: Alert and orientedTo name, place, and year unsure of the exact date. Well appearing and in no acute distress. Eyes: Conjunctivae are normal. PERRL. EOMI. Head: Abrasion/contusion to the back of the head with no skull fracture palpated. Nose: No congestion/rhinnorhea. Mouth/Throat: Mucous membranes are moist.  Oropharynx non-erythematous. Neck: No stridor.   Nontender with no meningismus Cardiovascular: Normal rate, regular rhythm. Grossly normal heart sounds.  Good peripheral circulation. Respiratory: Normal respiratory effort.  No retractions. Lungs CTAB. Abdominal: Soft and nontender. No distention. No guarding no rebound Back:  There is no focal tenderness or step off.  there is no midline tenderness there are no lesions noted.  there is no CVA tenderness Musculoskeletal: No lower extremity tenderness, no upper extremity tenderness. No joint effusions, no DVT signs strong distal pulses no edema Neurologic:  Normal speech and language. No gross focal neurologic deficits are appreciated.  Skin:  Skin is warm, dry and intact. No rash noted. Psychiatric: Mood and affect are normal. Speech and behavior are normal.  ____________________________________________   LABS (all labs ordered are listed, but only abnormal results are displayed)  Labs Reviewed - No data to display ____________________________________________  EKG  I personally interpreted any EKGs ordered by me or triage Normal sinus rhythm at 90 bpm no acute ST elevation noted. ST depression nonspecific ST changes noted. ____________________________________________  RADIOLOGY  I reviewed any imaging ordered by me or triage that were performed during my shift and, if possible, patient and/or family made aware  of any abnormal findings. ____________________________________________   PROCEDURES  Procedure(s) performed: None  Procedures  Critical Care performed: None  ____________________________________________   INITIAL IMPRESSION / ASSESSMENT AND PLAN / ED COURSE  Pertinent labs & imaging results that were available during my care of the patient were reviewed by me and considered in my medical decision making (see chart for details).  Patient with an non-syncopal fall with a closed head injury. He is not apparently on blood thinners but we did a cousin's age obtain CT scan anyway which is negative as expected. We will further assess the wound to the back of his head to see if it requires closure.  ----------------------------------------- 8:50 AM on 06/24/2016 -----------------------------------------  There is a very superficial scratch to the back of the head which will not require sutures. It is well approximated. I did offer to place 2  staples just to ensure that there was good cosmetic healing even though does appear to be superficial the family declines. They also declined blood work, at this time they feel that the patient is at his baseline this is a non-syncopal fall no prefer to be discharged. I do not think is unreasonable. He remains nonfocal and in no acute distress. We will update his tetanus and discharge him  Clinical Course   ____________________________________________   FINAL CLINICAL IMPRESSION(S) / ED DIAGNOSES  Final diagnoses:  None      This chart was dictated using voice recognition software.  Despite best efforts to proofread,  errors can occur which can change meaning.      Jeanmarie Plant, MD 06/24/16 0820    Jeanmarie Plant, MD 06/24/16 [phone]

## 2016-06-24 NOTE — ED Notes (Signed)
RN irrigated wound.   

## 2016-06-24 NOTE — ED Triage Notes (Signed)
[facility]  Per EMS no LOC.  Pt is on blood thinners and hit the back of his head.  Pt alert and at baseline. No complaints of pain. Pt requesting wife to be here.

## 2017-07-17 ENCOUNTER — Emergency Department: Payer: Medicare Other

## 2017-07-17 ENCOUNTER — Observation Stay (HOSPITAL_BASED_OUTPATIENT_CLINIC_OR_DEPARTMENT_OTHER)
Admit: 2017-07-17 | Discharge: 2017-07-17 | Disposition: A | Discharge status: Home / Self Care | Payer: Medicare Other | Attending: Internal Medicine | Admitting: Internal Medicine

## 2017-07-17 ENCOUNTER — Observation Stay
Admission: EM | Admit: 2017-07-17 | Discharge: 2017-07-18 | Payer: Medicare Other | Attending: Internal Medicine | Admitting: Internal Medicine

## 2017-07-17 ENCOUNTER — Observation Stay: Payer: Medicare Other

## 2017-07-17 DIAGNOSIS — J45909 Unspecified asthma, uncomplicated: Secondary | ICD-10-CM | POA: Insufficient documentation

## 2017-07-17 DIAGNOSIS — F329 Major depressive disorder, single episode, unspecified: Secondary | ICD-10-CM | POA: Diagnosis not present

## 2017-07-17 DIAGNOSIS — F419 Anxiety disorder, unspecified: Secondary | ICD-10-CM | POA: Diagnosis not present

## 2017-07-17 DIAGNOSIS — N183 Chronic kidney disease, stage 3 (moderate): Secondary | ICD-10-CM | POA: Diagnosis not present

## 2017-07-17 DIAGNOSIS — F0390 Unspecified dementia without behavioral disturbance: Secondary | ICD-10-CM | POA: Insufficient documentation

## 2017-07-17 DIAGNOSIS — R29810 Facial weakness: Principal | ICD-10-CM | POA: Insufficient documentation

## 2017-07-17 DIAGNOSIS — Z91041 Radiographic dye allergy status: Secondary | ICD-10-CM | POA: Diagnosis not present

## 2017-07-17 DIAGNOSIS — Z79899 Other long term (current) drug therapy: Secondary | ICD-10-CM | POA: Insufficient documentation

## 2017-07-17 DIAGNOSIS — I129 Hypertensive chronic kidney disease with stage 1 through stage 4 chronic kidney disease, or unspecified chronic kidney disease: Secondary | ICD-10-CM | POA: Insufficient documentation

## 2017-07-17 DIAGNOSIS — G3189 Other specified degenerative diseases of nervous system: Secondary | ICD-10-CM | POA: Diagnosis not present

## 2017-07-17 DIAGNOSIS — G40909 Epilepsy, unspecified, not intractable, without status epilepticus: Secondary | ICD-10-CM | POA: Insufficient documentation

## 2017-07-17 DIAGNOSIS — G459 Transient cerebral ischemic attack, unspecified: Secondary | ICD-10-CM | POA: Diagnosis present

## 2017-07-17 DIAGNOSIS — E7800 Pure hypercholesterolemia, unspecified: Secondary | ICD-10-CM | POA: Diagnosis not present

## 2017-07-17 DIAGNOSIS — R531 Weakness: Secondary | ICD-10-CM | POA: Insufficient documentation

## 2017-07-17 DIAGNOSIS — R29898 Other symptoms and signs involving the musculoskeletal system: Secondary | ICD-10-CM

## 2017-07-17 DIAGNOSIS — I5030 Unspecified diastolic (congestive) heart failure: Secondary | ICD-10-CM

## 2017-07-17 DIAGNOSIS — Z7982 Long term (current) use of aspirin: Secondary | ICD-10-CM | POA: Diagnosis not present

## 2017-07-17 LAB — CBC
HEMATOCRIT: 38.7 % — AB (ref 40.0–52.0)
HEMOGLOBIN: 13 g/dL (ref 13.0–18.0)
MCH: 29.6 pg (ref 26.0–34.0)
MCHC: 33.6 g/dL (ref 32.0–36.0)
MCV: 88.3 fL (ref 80.0–100.0)
Platelets: 293 10*3/uL (ref 150–440)
RBC: 4.38 MIL/uL — AB (ref 4.40–5.90)
RDW: 13.2 % (ref 11.5–14.5)
WBC: 5.7 10*3/uL (ref 3.8–10.6)

## 2017-07-17 LAB — DIFFERENTIAL
BASOS ABS: 0.1 10*3/uL (ref 0–0.1)
Basophils Relative: 1 %
EOS ABS: 0.4 10*3/uL (ref 0–0.7)
Eosinophils Relative: 8 %
LYMPHS ABS: 1.2 10*3/uL (ref 1.0–3.6)
LYMPHS PCT: 21 %
MONOS PCT: 10 %
Monocytes Absolute: 0.6 10*3/uL (ref 0.2–1.0)
NEUTROS PCT: 60 %
Neutro Abs: 3.5 10*3/uL (ref 1.4–6.5)

## 2017-07-17 LAB — COMPREHENSIVE METABOLIC PANEL
ALBUMIN: 4.3 g/dL (ref 3.5–5.0)
ALK PHOS: 60 U/L (ref 38–126)
ALT: 15 U/L — AB (ref 17–63)
AST: 24 U/L (ref 15–41)
Anion gap: 8 (ref 5–15)
BILIRUBIN TOTAL: 0.5 mg/dL (ref 0.3–1.2)
BUN: 20 mg/dL (ref 6–20)
CALCIUM: 10.5 mg/dL — AB (ref 8.9–10.3)
CO2: 28 mmol/L (ref 22–32)
CREATININE: 1.17 mg/dL (ref 0.61–1.24)
Chloride: 101 mmol/L (ref 101–111)
GFR calc Af Amer: >60 mL/min (ref >60)
GFR, EST NON AFRICAN AMERICAN: 56 mL/min — AB (ref >60)
GLUCOSE: 98 mg/dL (ref 65–99)
Potassium: 4.1 mmol/L (ref 3.5–5.1)
Sodium: 137 mmol/L (ref 135–145)
TOTAL PROTEIN: 7.4 g/dL (ref 6.5–8.1)

## 2017-07-17 LAB — URINALYSIS, COMPLETE (UACMP) WITH MICROSCOPIC
Bilirubin Urine: NEGATIVE
Glucose, UA: NEGATIVE mg/dL
HGB URINE DIPSTICK: NEGATIVE
Ketones, ur: NEGATIVE mg/dL
Leukocytes, UA: NEGATIVE
NITRITE: NEGATIVE
PROTEIN: NEGATIVE mg/dL
SPECIFIC GRAVITY, URINE: 1.006 (ref 1.005–1.030)
SQUAMOUS EPITHELIAL / LPF: NONE SEEN
pH: 8 (ref 5.0–8.0)

## 2017-07-17 LAB — GLUCOSE, CAPILLARY: Glucose-Capillary: 88 mg/dL (ref 65–99)

## 2017-07-17 LAB — PROTIME-INR
INR: 0.99
Prothrombin Time: 13 seconds (ref 11.4–15.2)

## 2017-07-17 LAB — APTT: APTT: 33 s (ref 24–36)

## 2017-07-17 LAB — TROPONIN I

## 2017-07-17 MED ORDER — PANTOPRAZOLE SODIUM 40 MG PO TBEC
40.0000 mg | DELAYED_RELEASE_TABLET | Freq: Two times a day (BID) | ORAL | Status: DC
  Administered 2017-07-17 – 2017-07-18 (×2): 40 mg via ORAL
  Filled 2017-07-17 (×3): qty 1

## 2017-07-17 MED ORDER — FENOFIBRATE 160 MG PO TABS
160.0000 mg | ORAL_TABLET | Freq: Every evening | ORAL | Status: DC
  Administered 2017-07-17: 160 mg via ORAL
  Filled 2017-07-17 (×2): qty 1

## 2017-07-17 MED ORDER — BUSPIRONE HCL 5 MG PO TABS
7.5000 mg | ORAL_TABLET | Freq: Three times a day (TID) | ORAL | Status: DC
  Administered 2017-07-17 – 2017-07-18 (×4): 7.5 mg via ORAL
  Filled 2017-07-17 (×6): qty 1.5

## 2017-07-17 MED ORDER — ENOXAPARIN SODIUM 40 MG/0.4ML ~~LOC~~ SOLN
40.0000 mg | SUBCUTANEOUS | Status: DC
  Filled 2017-07-17: qty 0.4

## 2017-07-17 MED ORDER — ASPIRIN 81 MG PO CHEW
CHEWABLE_TABLET | ORAL | Status: AC
  Filled 2017-07-17: qty 4

## 2017-07-17 MED ORDER — ASPIRIN 325 MG PO TABS
325.0000 mg | ORAL_TABLET | Freq: Every day | ORAL | Status: DC
  Administered 2017-07-17 – 2017-07-18 (×2): 325 mg via ORAL
  Filled 2017-07-17 (×2): qty 1

## 2017-07-17 MED ORDER — ACETAMINOPHEN 650 MG RE SUPP: 650.0000 mg | RECTAL | Status: DC | PRN

## 2017-07-17 MED ORDER — SUCRALFATE 1 GM/10ML PO SUSP
1.0000 g | Freq: Four times a day (QID) | ORAL | Status: DC
  Administered 2017-07-17 – 2017-07-18 (×4): 1 g via ORAL
  Filled 2017-07-17 (×4): qty 10

## 2017-07-17 MED ORDER — ASPIRIN 300 MG RE SUPP
300.0000 mg | Freq: Every day | RECTAL | Status: DC
  Filled 2017-07-17: qty 1

## 2017-07-17 MED ORDER — STROKE: EARLY STAGES OF RECOVERY BOOK
Freq: Once | Status: AC
  Administered 2017-07-17: 14:00:00

## 2017-07-17 MED ORDER — BOOST / RESOURCE BREEZE PO LIQD
1.0000 | Freq: Two times a day (BID) | ORAL | Status: DC
  Administered 2017-07-18: 1 via ORAL

## 2017-07-17 MED ORDER — ATORVASTATIN CALCIUM 20 MG PO TABS
40.0000 mg | ORAL_TABLET | Freq: Every day | ORAL | Status: DC
  Administered 2017-07-17: 18:00:00 40 mg via ORAL
  Filled 2017-07-17: qty 2

## 2017-07-17 MED ORDER — ASPIRIN 81 MG PO CHEW
324.0000 mg | CHEWABLE_TABLET | Freq: Once | ORAL | Status: DC
  Filled 2017-07-17: qty 4

## 2017-07-17 MED ORDER — LACOSAMIDE 50 MG PO TABS
50.0000 mg | ORAL_TABLET | Freq: Two times a day (BID) | ORAL | Status: DC
Start: 2017-07-17 — End: 2017-07-18
  Administered 2017-07-17 – 2017-07-18 (×3): 50 mg via ORAL
  Filled 2017-07-17 (×3): qty 1

## 2017-07-17 MED ORDER — ACETAMINOPHEN 160 MG/5ML PO SOLN
650.0000 mg | ORAL | Status: DC | PRN
  Filled 2017-07-17: qty 20.3

## 2017-07-17 MED ORDER — ACETAMINOPHEN 325 MG PO TABS
650.0000 mg | ORAL_TABLET | ORAL | Status: DC | PRN
  Administered 2017-07-17: 18:00:00 650 mg via ORAL
  Filled 2017-07-17: qty 2

## 2017-07-17 MED ORDER — NITROGLYCERIN 0.4 MG SL SUBL
SUBLINGUAL_TABLET | SUBLINGUAL | Status: AC
  Filled 2017-07-17: qty 1

## 2017-07-17 MED ORDER — LORAZEPAM 0.5 MG PO TABS
0.5000 mg | ORAL_TABLET | Freq: Two times a day (BID) | ORAL | Status: DC
  Administered 2017-07-17 – 2017-07-18 (×3): 0.5 mg via ORAL
  Filled 2017-07-17 (×3): qty 1

## 2017-07-17 NOTE — H&P (Signed)
[facility] [place] [facility]   PATIENT NAME: Benjamin Kim    MR#:  [id number]  DATE OF BIRTH:  [birth year]  DATE OF ADMISSION:  07/17/2017  PRIMARY CARE PHYSICIAN: Lauro Regulus, MD   REQUESTING/REFERRING PHYSICIAN:  Dr Sharma Covert  CHIEF COMPLAINT:  Facial droop HISTORY OF PRESENT ILLNESS:  Benjamin Kim  is a 83 y.o. male with a known history of CKD stage 3 and advanced dementia from [facility] who presents with right facial droop and RUE weakness. Patient with advanced dementia and Unable to obtain history of present illness from patient. History of present illness taken from ER physician. Apparently patient was found to have right facial droop and right upper extremity weakness.  He was sent to the ER for further evaluation. ER physician spoke with our neurologist who recommended patient be admitted for TIA workup. His symptoms have now resolved. Family is at bedside,  PAST MEDICAL HISTORY:   Past Medical History:  Diagnosis Date  . Anxiety   . Asthma   . CKD (chronic kidney disease) stage 3, GFR 30-59 ml/min (HCC)   . Dementia   . Depressed   . Hypercholesteremia   . Hypertension     PAST SURGICAL HISTORY:   Past Surgical History:  Procedure Laterality Date  . PROSTATE SURGERY      SOCIAL HISTORY:   Social History  Substance Use Topics  . Smoking status: Never Smoker  . Smokeless tobacco: Never Used  . Alcohol use No    FAMILY HISTORY:   Family History  Problem Relation Age of Onset  . Lung cancer Father     DRUG ALLERGIES:   Allergies  Allergen Reactions  . Ivp Dye [Iodinated Diagnostic Agents] Anaphylaxis    REVIEW OF SYSTEMS:   Review of Systems  Unable to perform ROS: Dementia    MEDICATIONS AT HOME:   Prior to Admission medications   Medication Sig Start Date End Date Taking? Authorizing Provider  busPIRone (BUSPAR) 7.5 MG tablet Take 7.5 mg by mouth 3 (three) times daily.   Yes [provider]   fenofibrate 160 MG tablet Take 160 mg by mouth every evening.   Yes [provider]  ferrous sulfate 325 (65 FE) MG tablet Take 1 tablet (325 mg total) by mouth 2 (two) times daily with a meal. Patient taking differently: Take 325 mg by mouth 2 (two) times daily before a meal.  07/15/15  Yes Sudini, Wardell Heath, MD  lacosamide (VIMPAT) 50 MG TABS tablet Take 1 tablet (50 mg total) by mouth 2 (two) times daily. 05/26/16  Yes Wieting, Richard, MD  LORazepam (ATIVAN) 1 MG tablet Take 0.5 mg by mouth 2 (two) times daily.   Yes [provider]  pantoprazole (PROTONIX) 40 MG tablet Take 1 tablet (40 mg total) by mouth 2 (two) times daily before a meal. 07/15/15  Yes Sudini, Srikar, MD  sucralfate (CARAFATE) 1 GM/10ML suspension Take 10 mLs (1 g total) by mouth 4 (four) times daily -  with meals and at bedtime. Patient taking differently: Take 1 g by mouth 4 (four) times daily.  07/15/15  Yes Milagros Loll, MD  venlafaxine XR (EFFEXOR-XR) 150 MG 24 hr capsule Take 1 capsule (150 mg total) by mouth daily with breakfast. Take two tablets by mouth once daily Patient taking differently: Take 300 mg by mouth daily with breakfast.  05/26/16  Yes Wieting, Richard, MD  clindamycin (CLEOCIN) 300 MG capsule Take 1 capsule (300 mg total) by mouth every  8 (eight) hours. Patient not taking: Reported on 06/24/2016 05/26/16   Alford Highland, MD  feeding supplement (BOOST / RESOURCE BREEZE) LIQD Take 1 Container by mouth 2 (two) times daily with a meal. 07/15/15   Sudini, Wardell Heath, MD  ipratropium-albuterol (DUONEB) 0.5-2.5 (3) MG/3ML SOLN Take 3 mLs by nebulization every 6 (six) hours as needed. Patient taking differently: Take 3 mLs by nebulization every 6 (six) hours as needed. For shortness of breath or wheezing 05/26/16   Alford Highland, MD  polyethylene glycol [facility]) packet Take 17 g by mouth daily as needed for moderate constipation or severe constipation. Patient not taking: Reported on  07/17/2017 07/15/15   Milagros Loll, MD  QUEtiapine (SEROQUEL) 25 MG tablet Take 1 tablet (25 mg total) by mouth at bedtime. Patient not taking: Reported on 07/17/2017 05/26/16   Alford Highland, MD  senna-docusate (SENOKOT-S) 8.6-50 MG tablet Take 2 tablets by mouth 2 (two) times daily. Patient not taking: Reported on 07/17/2017 07/15/15   Milagros Loll, MD      VITAL SIGNS:  Blood pressure 134/65, pulse 75, temperature 98.3 F (36.8 C), resp. rate (!) 9, weight 68 kg (150 lb), SpO2 100 %.  PHYSICAL EXAMINATION:   Physical Exam  Constitutional: He is well-developed, well-nourished, and in no distress. No distress.  HENT:  Head: Normocephalic.  Eyes: No scleral icterus.  Neck: Normal range of motion. Neck supple. No JVD present. No tracheal deviation present.  Cardiovascular: Normal rate, regular rhythm and normal heart sounds.  Exam reveals no gallop and no friction rub.   No murmur heard. Pulmonary/Chest: Effort normal and breath sounds normal. No respiratory distress. He has no wheezes. He has no rales. He exhibits no tenderness.  Abdominal: Soft. Bowel sounds are normal. He exhibits no distension and no mass. There is no tenderness. There is no rebound and no guarding.  Musculoskeletal: He exhibits deformity. He exhibits no edema.  Left hand contracture  Neurological: He is alert.  Does not follow all commands Down going toes  Skin: Skin is warm. No rash noted. No erythema.      LABORATORY PANEL:   CBC  Recent Labs Lab 07/17/17 0959  WBC 5.7  HGB 13.0  HCT 38.7*  PLT 293   ------------------------------------------------------------------------------------------------------------------  Chemistries   Recent Labs Lab 07/17/17 0959  NA 137  K 4.1  CL 101  CO2 28  GLUCOSE 98  BUN 20  CREATININE 1.17  CALCIUM 10.5*  AST 24  ALT 15*  ALKPHOS 60  BILITOT 0.5    ------------------------------------------------------------------------------------------------------------------  Cardiac Enzymes  Recent Labs Lab 07/17/17 0959  TROPONINI <0.03   ------------------------------------------------------------------------------------------------------------------  RADIOLOGY:  Ct Head Wo Contrast  Result Date: 07/17/2017 CLINICAL DATA:  83 year old male with right-sided facial droop and right arm weakness. History of dementia. Initial encounter. EXAM: CT HEAD WITHOUT CONTRAST TECHNIQUE: Contiguous axial images were obtained from the base of the skull through the vertex without intravenous contrast. COMPARISON:  07/04/2016 CT. FINDINGS: Brain: No intracranial hemorrhage or CT evidence of large acute infarct. Moderate-to-marked chronic microvascular changes. Global atrophy. No intracranial mass lesion noted on this unenhanced exam. Vascular: Vascular calcifications. Skull: No acute abnormality. Sinuses/Orbits: No acute orbital abnormality. Partial opacification ethmoid sinus air cells. Other: Negative. IMPRESSION: No intracranial hemorrhage or CT evidence of large acute infarct. Moderate-to-marked chronic microvascular changes. Global atrophy. Electronically Signed   By: Lacy Duverney M.D.   On: 07/17/2017 10:29    EKG:  Sinus rhythm no ST elevation or depression  IMPRESSION AND  PLAN:   83 year old male with history of dementia from [facility] who presents with right upper extremity weakness which has resolved.  1. Right upper extremity weakness: Patient will be evaluated for TIA. Continue workup with MRI, carotid Dopplers and echocardiogram. Continue neuro checks Start aspirin and statin Continue fenofibrate Check lipid panel PT, OT and speech consultation requested Neurology consultation requested  2. History of dementia: Continue Effexor, Seroquel and Ativan when necessary  3. History of seizure disorder: Continue Vimpat  4. Chronic kidney  disease stage III: Creatinine is at baseline    All the records are reviewed and case discussed with ED provider. Management plans discussed with family who is in agreement.  CODE STATUS: FULL  TOTAL TIME TAKING CARE OF THIS PATIENT: 43 minutes.    Nizhoni Parlow M.D on 07/17/2017 at 12:05 PM  Between 7am to 6pm - Pager - [phone]  After 6pm go to [email or web address] - Social research officer, government  [facility] [place] [facility]  Office  [phone]  CC: Primary care physician; Lauro Regulus, MD

## 2017-07-17 NOTE — ED Triage Notes (Addendum)
Pt arrives to ER via [facility] from [facility]. EMS reporting that call came in as "sick call", when staff inquiring about patient symptoms, staff stated that pt had right sided facial droop and right arm weakness, unknown last known well time. Pt has hx of dementia, alert to self only at this time. Pt follows commands correctly. Left side hand contracted and painful at baseline. Pt has full strength and grip of right hand. Pt equal facial symmetry when smiling, at rest right side does appear very mildly droopy. Pt has a lot of saliva in mouth, speaks clear after spitting out.

## 2017-07-17 NOTE — Evaluation (Signed)
Occupational Therapy Evaluation Patient Details Name: Benjamin Kim MRN: [id number] DOB: 10-04-1932 Today's Date: 07/17/2017    History of Present Illness 83 y.o. male with a history of dementia and seizures who per report of staff at his facility had right facial droop and right upper extremity weakness.  Patient was sent in for evaluation at that time.  By the time the patient arrived at the ED, he appeared at baseline. CT and MRI both negative. MRA and carotid ulstrasound pending.    Clinical Impression   Pt is an 83 year old male from [place] [facility] memory care unit. Spouse present for evaluation. Pt assessed after nursing neuro assessment and medications given. Pt appears frustrated and slightly upset but able to follow simple commands with RUE weakness and R side facial droop near baseline now. Spouse reports pt receives max assist for ADL tasks at ALF and supervision to min guard for ambulation with RW. Endorses a couple falls in past 12 months. Spouse notes that he will perform self feeding but does better with assist in terms of quantity of food intake. Pt required mod assist and verbal cues to use hand rail to assist with improving upright posture while in bed, as pt was leaning heavily to L side. Pillow positioned under LUE to provide support with pt reporting improved comfort. L hand contracture noted, per spouse, is contracted at baseline for the past year or so with little improvement from therapy efforts. Pt would benefit from continued skilled OT services for education and training in functional mobility, self care skills, and home/routines modifications and compensatory strategies to maximize functional independence, minimize caregiver burden, and minimize falls risk in order to return to PLOF. Additional mobility deferred this date due to pt's cognition and frustration after nursing and OT assessments back to back. Given pt's baseline level of assist required for ADL, anticipate pt  will be able to return to ALF memory care unit with similar level of assist in place with home health therapy in setting of ALF to maximize return to PLOF. Will continue to assess mobility next date.    Follow Up Recommendations  Home health OT;Supervision/Assistance - 24 hour (HH in setting of ALF and 24/7 supervision/assist)    Equipment Recommendations  Other (comment) (TBD by next venue of care)    Recommendations for Other Services       Precautions / Restrictions Precautions Precautions: Fall Restrictions Weight Bearing Restrictions: No      Mobility Bed Mobility Overal bed mobility: Needs Assistance             General bed mobility comments: mod assist to adjust upright posture while long sitting in bed with HOB elevated. Adjusted pillows to support LUE and minimize lean to the left side.   Transfers                 General transfer comment: deferred due to safety/cognition this afternoon, continue to assess tomorrow    Balance Overall balance assessment: History of Falls;Needs assistance     Sitting balance - Comments: unsafe to attempt this afternoon, continue to assess       Standing balance comment: unsafe to attempt this afternoon, continue to assess                           ADL either performed or assessed with clinical judgement   ADL Overall ADL's : Needs assistance/impaired (near baseline for ADL functional status, per chart  and per spouse for ADL tasks) Eating/Feeding: Bed level;Set up;Minimal assistance       Upper Body Bathing: Maximal assistance;Bed level;Moderate assistance   Lower Body Bathing: Maximal assistance;Bed level   Upper Body Dressing : Moderate assistance;Maximal assistance;Bed level   Lower Body Dressing: Maximal assistance;Bed level     Toilet Transfer Details (indicate cue type and reason): unsafe to attempt this date                 Vision Baseline Vision/History: Wears glasses Wears  Glasses: At all times Patient Visual Report: No change from baseline Additional Comments: likely visual deficits due to dementia, difficult to assess this date. Did visually attend to staff around room when name was called     Perception     Praxis      Pertinent Vitals/Pain Pain Assessment: Faces Faces Pain Scale: Hurts little more Pain Location: LUE when taking BP, L hand when attempted to extend fingers (fisted L hand contracture at baseline) Pain Descriptors / Indicators: Grimacing Pain Intervention(s): Monitored during session;Repositioned     Hand Dominance Right   Extremity/Trunk Assessment Upper Extremity Assessment Upper Extremity Assessment: RUE deficits/detail;LUE deficits/detail RUE Deficits / Details: ROM WFL, at least 4-/5 grossly LUE Deficits / Details: R shoulder ROM approx 0-90 AROM, L hand in fisted contracture (spouse present, reports therapy at [facility] has attempted to manage/treat but no success), painful with any attempt to move hand/fingers LUE: Unable to fully assess due to pain   Lower Extremity Assessment Lower Extremity Assessment: Defer to PT evaluation;Difficult to assess due to impaired cognition       Communication Communication Communication: Other (comment) (dementia)   Cognition Arousal/Alertness: Awake/alert Behavior During Therapy: Flat affect Overall Cognitive Status: History of cognitive impairments - at baseline                                 General Comments: pt with severe dementia at baseline, oriented to self (his baseline), able to follow simple 1 step commands, requires verbal cues for multi step commands, difficulty expressing when in pain   General Comments       Exercises     Shoulder Instructions      Home Living Family/patient expects to be discharged to:: Assisted living (memory care unit at [facility])                             Home Equipment: Dan Humphreys - 2 wheels          Prior  Functioning/Environment Level of Independence: Needs assistance  Gait / Transfers Assistance Needed: supervision to min guard from staff with RW for ambulation per spouse ADL's / Homemaking Assistance Needed: dependent on staff for bathing, dressing, grooming, toileting, and set up and PRN assist for self feeding (spouse reports that he will eat more if she assists with feeding)   Comments: per spouse, pt has had a couple falls in past 12 months        OT Problem List: Decreased strength;Decreased activity tolerance;Decreased safety awareness;Impaired balance (sitting and/or standing);Decreased cognition      OT Treatment/Interventions: Self-care/ADL training;Therapeutic exercise;Therapeutic activities;DME and/or AE instruction;Balance training;Patient/family education    OT Goals(Current goals can be found in the care plan section) Acute Rehab OT Goals Patient Stated Goal: return to ALF memory care OT Goal Formulation: With patient/family Time For Goal Achievement: 07/24/17 Potential to Achieve  Goals: Good  OT Frequency: Min 1X/week   Barriers to D/C:            Co-evaluation              AM-PAC PT "6 Clicks" Daily Activity     Outcome Measure Help from another person eating meals?: A Little Help from another person taking care of personal grooming?: A Little Help from another person toileting, which includes using toliet, bedpan, or urinal?: A Lot Help from another person bathing (including washing, rinsing, drying)?: A Lot Help from another person to put on and taking off regular upper body clothing?: A Lot Help from another person to put on and taking off regular lower body clothing?: A Lot 6 Click Score: 14   End of Session    Activity Tolerance: Other (comment) (pt somewhat frustrated, fatigued after nursing assessment just prior to OT evaluation) Patient left: in bed;with call bell/phone within reach;with bed alarm set;with family/visitor present;with  nursing/sitter in room (nurse tech in room to check vitals)  OT Visit Diagnosis: Other abnormalities of gait and mobility (R26.89);Muscle weakness (generalized) (M62.81);History of falling (Z91.81)                Time: 1500-1516 OT Time Calculation (min): 16 min Charges:  OT General Charges $OT Visit: 1 Visit OT Evaluation $OT Eval Low Complexity: 1 Low G-Codes: OT G-codes **NOT FOR INPATIENT CLASS** Functional Assessment Tool Used: AM-PAC 6 Clicks Daily Activity;Clinical judgement Functional Limitation: Self care Self Care Current Status (R6045): At least 40 percent but less than 60 percent impaired, limited or restricted Self Care Goal Status (W0981): At least 20 percent but less than 40 percent impaired, limited or restricted   Richrd Prime, MPH, MS, OTR/L ascom [phone] 07/17/17, 3:45 PM

## 2017-07-17 NOTE — Consult Note (Signed)
Referring Physician: Sharma Covert    Chief Complaint: Facial droop, RUE weakness  HPI: Benjamin Kim is an 83 y.o. male with a history of dementia and seizures who per report of staff at his facility had right facial droop and right upper extremity weakness.  Patient was sent in for evaluation at that time.  By the time the patient arrived at the ED, he appeared at baseline.  Due to dementia patient unable to provide any history.  Family not able to be reached.  Initial NIHSS of 2.  Date last known well: Unable to determine Time last known well: Unable to determine tPA Given: No: Resolution of symptoms, unable to determine LKW  Past Medical History:  Diagnosis Date  . Anxiety   . Asthma   . CKD (chronic kidney disease) stage 3, GFR 30-59 ml/min (HCC)   . Dementia   . Depressed   . Hypercholesteremia   . Hypertension     Past Surgical History:  Procedure Laterality Date  . PROSTATE SURGERY      Family History  Problem Relation Age of Onset  . Lung cancer Father    Social History:  reports that he has never smoked. He has never used smokeless tobacco. He reports that he does not drink alcohol. His drug history is not on file.  Allergies:  Allergies  Allergen Reactions  . Ivp Dye [Iodinated Diagnostic Agents] Anaphylaxis    Medications: I have reviewed the patient's current medications. Prior to Admission:  Prior to Admission medications   Medication Sig Start Date End Date Taking? Authorizing Provider  busPIRone (BUSPAR) 7.5 MG tablet Take 7.5 mg by mouth 3 (three) times daily.   Yes [provider]  fenofibrate 160 MG tablet Take 160 mg by mouth every evening.   Yes [provider]  ferrous sulfate 325 (65 FE) MG tablet Take 1 tablet (325 mg total) by mouth 2 (two) times daily with a meal. Patient taking differently: Take 325 mg by mouth 2 (two) times daily before a meal.  07/15/15  Yes Sudini, Wardell Heath, MD  lacosamide (VIMPAT) 50 MG TABS tablet Take 1 tablet  (50 mg total) by mouth 2 (two) times daily. 05/26/16  Yes Wieting, Richard, MD  LORazepam (ATIVAN) 1 MG tablet Take 0.5 mg by mouth 2 (two) times daily.   Yes [provider]  pantoprazole (PROTONIX) 40 MG tablet Take 1 tablet (40 mg total) by mouth 2 (two) times daily before a meal. 07/15/15  Yes Sudini, Srikar, MD  sucralfate (CARAFATE) 1 GM/10ML suspension Take 10 mLs (1 g total) by mouth 4 (four) times daily -  with meals and at bedtime. Patient taking differently: Take 1 g by mouth 4 (four) times daily.  07/15/15  Yes Milagros Loll, MD  venlafaxine XR (EFFEXOR-XR) 150 MG 24 hr capsule Take 1 capsule (150 mg total) by mouth daily with breakfast. Take two tablets by mouth once daily Patient taking differently: Take 300 mg by mouth daily with breakfast.  05/26/16  Yes Wieting, Richard, MD  clindamycin (CLEOCIN) 300 MG capsule Take 1 capsule (300 mg total) by mouth every 8 (eight) hours. Patient not taking: Reported on 06/24/2016 05/26/16   Alford Highland, MD  feeding supplement (BOOST / RESOURCE BREEZE) LIQD Take 1 Container by mouth 2 (two) times daily with a meal. 07/15/15   Sudini, Wardell Heath, MD  ipratropium-albuterol (DUONEB) 0.5-2.5 (3) MG/3ML SOLN Take 3 mLs by nebulization every 6 (six) hours as needed. Patient taking differently: Take 3 mLs by nebulization  every 6 (six) hours as needed. For shortness of breath or wheezing 05/26/16   Alford Highland, MD  polyethylene glycol [facility] Ethelene Hal) packet Take 17 g by mouth daily as needed for moderate constipation or severe constipation. Patient not taking: Reported on 07/17/2017 07/15/15   Milagros Loll, MD  QUEtiapine (SEROQUEL) 25 MG tablet Take 1 tablet (25 mg total) by mouth at bedtime. Patient not taking: Reported on 07/17/2017 05/26/16   Alford Highland, MD  senna-docusate (SENOKOT-S) 8.6-50 MG tablet Take 2 tablets by mouth 2 (two) times daily. Patient not taking: Reported on 07/17/2017 07/15/15   Milagros Loll, MD   ROS: Unable to  provide due to dementia  Physical Examination: Blood pressure 134/65, pulse 75, temperature 98.3 F (36.8 C), resp. rate (!) 9, weight 68 kg (150 lb), SpO2 100 %.  HEENT-  Normocephalic, no lesions, without obvious abnormality.  Normal external eye and conjunctiva.  Normal TM's bilaterally.  Normal auditory canals and external ears. Normal external nose, mucus membranes and septum.  Normal pharynx. Cardiovascular- S1, S2 normal, pulses palpable throughout   Lungs- chest clear, no wheezing, rales, normal symmetric air entry Abdomen- soft, non-tender; bowel sounds normal; no masses,  no organomegaly Extremities- no edema, left hand fisted and patient reports pain when hand attempted to be opened Lymph-no adenopathy palpable Musculoskeletal-left hand fisted Skin-warm and dry, no hyperpigmentation, vitiligo, or suspicious lesions  Neurological Examination   Mental Status: Alert.  Oriented to name only.  Reports that he is 30.  Does not know where he is or where he lives.  Speech fluent without evidence of aphasia.  Able to follow simple commands without difficulty but requires extensive reinforcement for 3-step commands. Cranial Nerves: II: Discs flat bilaterally; Visual fields grossly normal, pupils equal, round, reactive to light and accommodation III,IV, VI: ptosis not present, extra-ocular motions intact bilaterally V,VII: decreased right NLF, facial light touch sensation normal bilaterally VIII: hearing decreased bilaterally IX,X: gag reflex present XI: bilateral shoulder shrug XII: midline tongue extension Motor: Able to hold all extremities against gravity with no focal weakness noted.  Fists the left hand. Sensory: Pinprick and light touch intact throughout, bilaterally Deep Tendon Reflexes: 2+ in the RUE, 1+ in the LUE and absent in the lower extremities Plantars: Right: downgoing   Left: downgoing Cerebellar: Normal finger-to-nose and normal heel-to-shin testing  Gait: not  tested due to safety concerns    Laboratory Studies:  Basic Metabolic Panel:  Recent Labs Lab 07/17/17 0959  NA 137  K 4.1  CL 101  CO2 28  GLUCOSE 98  BUN 20  CREATININE 1.17  CALCIUM 10.5*    Liver Function Tests:  Recent Labs Lab 07/17/17 0959  AST 24  ALT 15*  ALKPHOS 60  BILITOT 0.5  PROT 7.4  ALBUMIN 4.3   No results for input(s): LIPASE, AMYLASE in the last 168 hours. No results for input(s): AMMONIA in the last 168 hours.  CBC:  Recent Labs Lab 07/17/17 0959  WBC 5.7  NEUTROABS 3.5  HGB 13.0  HCT 38.7*  MCV 88.3  PLT 293    Cardiac Enzymes:  Recent Labs Lab 07/17/17 0959  TROPONINI <0.03    BNP: Invalid input(s): POCBNP  CBG:  Recent Labs Lab 07/17/17 1000  GLUCAP 88    Microbiology: Results for orders placed or performed during the hospital encounter of 05/23/16  MRSA PCR Screening     Status: None   Collection Time: 05/24/16 12:07 PM  Result Value Ref Range Status  MRSA by PCR NEGATIVE NEGATIVE Final    Comment:        The GeneXpert MRSA Assay (FDA approved for NASAL specimens only), is one component of a comprehensive MRSA colonization surveillance program. It is not intended to diagnose MRSA infection nor to guide or monitor treatment for MRSA infections.     Coagulation Studies:  Recent Labs  07/17/17 0959  LABPROT 13.0  INR 0.99    Urinalysis:  Recent Labs Lab 07/17/17 1047  COLORURINE YELLOW*  LABSPEC 1.006  PHURINE 8.0  GLUCOSEU NEGATIVE  HGBUR NEGATIVE  BILIRUBINUR NEGATIVE  KETONESUR NEGATIVE  PROTEINUR NEGATIVE  NITRITE NEGATIVE  LEUKOCYTESUR NEGATIVE    Lipid Panel: No results found for: CHOL, TRIG, HDL, CHOLHDL, VLDL, LDLCALC  HgbA1C: No results found for: HGBA1C  Urine Drug Screen:  No results found for: LABOPIA, COCAINSCRNUR, LABBENZ, AMPHETMU, THCU, LABBARB  Alcohol Level: No results for input(s): ETH in the last 168 hours.  Other results: EKG: sinus rhythm at 75  bpm.  Imaging: Ct Head Wo Contrast  Result Date: 07/17/2017 CLINICAL DATA:  83 year old male with right-sided facial droop and right arm weakness. History of dementia. Initial encounter. EXAM: CT HEAD WITHOUT CONTRAST TECHNIQUE: Contiguous axial images were obtained from the base of the skull through the vertex without intravenous contrast. COMPARISON:  07/04/2016 CT. FINDINGS: Brain: No intracranial hemorrhage or CT evidence of large acute infarct. Moderate-to-marked chronic microvascular changes. Global atrophy. No intracranial mass lesion noted on this unenhanced exam. Vascular: Vascular calcifications. Skull: No acute abnormality. Sinuses/Orbits: No acute orbital abnormality. Partial opacification ethmoid sinus air cells. Other: Negative. IMPRESSION: No intracranial hemorrhage or CT evidence of large acute infarct. Moderate-to-marked chronic microvascular changes. Global atrophy. Electronically Signed   By: Lacy Duverney M.D.   On: 07/17/2017 10:29    Assessment: 83 y.o. male with reported facial droop and RUE weakness.  Patient with continued mild facial droop but this is old and was seen on his neurological evaluation in August of last year.  With symptoms resolved can not rule out TIA.  Patient on no antiplatelet therapy.   Further work up recommended.    Stroke Risk Factors - hyperlipidemia and hypertension  Plan: 1. HgbA1c, fasting lipid panel 2. MRI, MRA  of the brain without contrast 3. PT consult, OT consult, Speech consult 4. Echocardiogram 5. Carotid dopplers 6. Prophylactic therapy-Antiplatelet med: Aspirin - dose  daily 7. NPO until RN stroke swallow screen 8. Telemetry monitoring 9. Frequent neuro checks   Thana Farr, MD Neurology [phone] 07/17/2017, 12:06 PM

## 2017-07-17 NOTE — Progress Notes (Signed)
Pt. Non compliant to dr medical orders. Display gritting of teeth and stiffened guarding of arm and abdomen when approach or attempt to move. Dr Mitzi Hansen contacted via telephone regarding refusal of centraly telemetry mointoring, MRI and ultrasound. Aske for pain medicine. Dr Mitzi Hansen supplied verbal order. Pt. Refused stating "leave me alone I don't want all of it". Spoke with spouse would allow pt. To rest and offer pain medicine upon returning at hourly rounding. Upon end of conversation pt. Eyes closed with stated attempt to rest.

## 2017-07-17 NOTE — ED Notes (Signed)
Patient transported to MRI 

## 2017-07-17 NOTE — ED Notes (Signed)
ED Provider at bedside. 

## 2017-07-17 NOTE — ED Notes (Signed)
Report to Brandy, RN.

## 2017-07-17 NOTE — ED Provider Notes (Signed)
[facility] Emergency Department Provider Note  ____________________________________________  Time seen: Approximately 10:50 AM  I have reviewed the triage vital signs and the nursing notes.   HISTORY  Chief Complaint Facial Droop  The patient's hx is limited due to his dementia.   HPI Benjamin Kim is a 83 y.o. male w/ a hx of advanced dementia from [place], [place], CKD presenting w/ R facial droop and RUE weakness.the patient is unable to give any history, does not know where he is or why he is here. He denies any pain at this time. Per report, the patient was found to have right facial droop and right upper extremity weakness with unknown last normal time. Blood sugar and vital signs were reassuring on arrival.   Past Medical History:  Diagnosis Date  . Anxiety   . Asthma   . CKD (chronic kidney disease) stage 3, GFR 30-59 ml/min (HCC)   . Dementia   . Depressed   . Hypercholesteremia   . Hypertension     Patient Active Problem List   Diagnosis Date Noted  . Seizure (HCC) 05/24/2016  . Protein-calorie malnutrition, severe (HCC) 07/14/2015  . Dementia with behavioral disturbance 07/13/2015  . Depression 07/13/2015  . Anemia 07/12/2015  . Gastrointestinal bleeding 07/12/2015    Past Surgical History:  Procedure Laterality Date  . PROSTATE SURGERY      Current Outpatient Rx  . Order #: [id number] Class: Historical Med  . Order #: [id number] Class: Historical Med  . Order #: [id number] Class: No Print  . Order #: [id number] Class: Print  . Order #: [id number] Class: Historical Med  . Order #: [id number] Class: No Print  . Order #: [id number] Class: No Print  . Order #: [id number] Class: Print  . Order #: [id number] Class: Print  . Order #: [id number] Class: No Print  . Order #: [id number] Class: Print  . Order #: [id number] Class: No Print  . Order #: [id number] Class: Print  . Order #: [id number] Class: No Print    Allergies Ivp dye [iodinated  diagnostic agents]  Family History  Problem Relation Age of Onset  . Lung cancer Father     Social History Social History  Substance Use Topics  . Smoking status: Never Smoker  . Smokeless tobacco: Never Used  . Alcohol use No    Review of Systems Unable to obtain  ____________________________________________   PHYSICAL EXAM:  VITAL SIGNS: ED Triage Vitals  Enc Vitals Group     BP 07/17/17 1000 (!) 165/93     Pulse Rate 07/17/17 0949 78     Resp 07/17/17 0949 18     Temp 07/17/17 1007 98.3 F (36.8 C)     Temp src --      SpO2 07/17/17 1030 100 %     Weight 07/17/17 0950 150 lb (68 kg)     Height --      Head Circumference --      Peak Flow --      Pain Score --      Pain Loc --      Pain Edu? --      Excl. in GC? --     Constitutional: the patient is alert with clear speech and able to answer some questions but is not oriented. He is nontoxic. Eyes: Conjunctivae are normal.  EOMI. PERRLA.No scleral icterus. Head: Atraumatic. Nose: No congestion/rhinnorhea. Mouth/Throat: Mucous membranes are moist.  Neck: No stridor.  Supple.  No JVD. No meningismus. Cardiovascular: Normal rate, regular rhythm. No murmurs, rubs or gallops.  Respiratory:  Normal respiratory effort.  No accessory muscle use or retractions. Lungs CTAB.  No wheezes, rales or ronchi. Gastrointestinal: Soft, nontender and nondistended.  No guarding or rebound.  No peritoneal signs. Musculoskeletal: No LE edema. No ttp in the calves or palpable cords.  Negative Homan's sign.the patient has a chronically contracted left upper extremity. Neurologic: Alert but not oriented. Speech is clear. Naming is abnormal - states my necklace is a ring.minimal loss of the right nasolabial fold with symmetric smile. The patient is unable to comply with tongue deviation examination. Unable to comply with pronator drift. 5 out of 5 R grip, biceps, triceps, and bilateral hip flexors, plantar flexion and dorsiflexion. The  patient has contracture of the left upper extremity is unable to comply with examination. Sensory and cerebellar examinations are limited due to patient dementia. Skin:  Skin is warm, dry and intact. No rash noted. Psychiatric: unable to assess; nurse reports intermittent tearfulness  ____________________________________________   LABS (all labs ordered are listed, but only abnormal results are displayed)  Labs Reviewed  CBC - Abnormal; Notable for the following:       Result Value   RBC 4.38 (*)    HCT 38.7 (*)    All other components within normal limits  COMPREHENSIVE METABOLIC PANEL - Abnormal; Notable for the following:    Calcium 10.5 (*)    ALT 15 (*)    GFR calc non Af Amer 56 (*)    All other components within normal limits  URINALYSIS, COMPLETE (UACMP) WITH MICROSCOPIC - Abnormal; Notable for the following:    Color, Urine YELLOW (*)    APPearance CLOUDY (*)    Bacteria, UA RARE (*)    All other components within normal limits  PROTIME-INR  APTT  DIFFERENTIAL  TROPONIN I  GLUCOSE, CAPILLARY  CBG MONITORING, ED   ____________________________________________  EKG  ED ECG REPORT I, Rockne Menghini, the attending physician, personally viewed and interpreted this ECG.   Date: 07/17/2017  EKG Time: 948  Rate: 75  Rhythm: normal sinus rhythm  Axis: normal  Intervals:none  ST&T Change: Nonspecific T-wave inversion in V1. No STEMI  ____________________________________________  RADIOLOGY  Ct Head Wo Contrast  Result Date: 07/17/2017 CLINICAL DATA:  83 year old male with right-sided facial droop and right arm weakness. History of dementia. Initial encounter. EXAM: CT HEAD WITHOUT CONTRAST TECHNIQUE: Contiguous axial images were obtained from the base of the skull through the vertex without intravenous contrast. COMPARISON:  07/04/2016 CT. FINDINGS: Brain: No intracranial hemorrhage or CT evidence of large acute infarct. Moderate-to-marked chronic  microvascular changes. Global atrophy. No intracranial mass lesion noted on this unenhanced exam. Vascular: Vascular calcifications. Skull: No acute abnormality. Sinuses/Orbits: No acute orbital abnormality. Partial opacification ethmoid sinus air cells. Other: Negative. IMPRESSION: No intracranial hemorrhage or CT evidence of large acute infarct. Moderate-to-marked chronic microvascular changes. Global atrophy. Electronically Signed   By: Lacy Duverney M.D.   On: 07/17/2017 10:29    ____________________________________________   PROCEDURES  Procedure(s) performed: None  Procedures  Critical Care performed: No ____________________________________________   INITIAL IMPRESSION / ASSESSMENT AND PLAN / ED COURSE  Pertinent labs & imaging results that were available during my care of the patient were reviewed by me and considered in my medical decision making (see chart for details).  83 y.o. male with baseline left upper extremity contracture presenting for right facial droop and right upper extremity weakness. On my examination, the patient is hemodynamically stable. He has mild loss of the right nasolabial fold but  otherwise has normal strength in the right upper extremity and no other focal neurologic deficits. His left upper extremity contracture is chronic. His CT scan does not show any acute intracranial process and his EKG does not show ischemic changes. I have attempted to call the patient's responsible party, Ernestine Mcmurray, and have left a message as nobody answered. I've spoken with Dr. Thad Ranger, the neurologist on-call, who will come evaluate the patient. I ordered an MRI for further evaluation, but may reevaluate this order if this is not within the goals of care for this patient once I hear back from the family. At this time, it is possible the patient had a stroke, but he is not a candidate for TPA. We will look for other abnormalities including electrolyte abnormality, urinalysis for  UTI, and reevaluate the patient for final disposition.  ----------------------------------------- 11:28 AM on 07/17/2017 -----------------------------------------  At this time, the patient's family has not yet called me back. He has been evaluated by Dr. Thad Ranger, who recommends inpatient evaluation for possible TIA. An MRI has been ordered and an aspirin has been ordered as well as the patient does not take this regularly.  ____________________________________________  FINAL CLINICAL IMPRESSION(S) / ED DIAGNOSES  Final diagnoses:  Facial droop  Right arm weakness         NEW MEDICATIONS STARTED DURING THIS VISIT:  New Prescriptions   No medications on file      Rockne Menghini, MD 07/17/17 1129

## 2017-07-18 DIAGNOSIS — G459 Transient cerebral ischemic attack, unspecified: Secondary | ICD-10-CM | POA: Diagnosis not present

## 2017-07-18 LAB — LIPID PANEL
CHOL/HDL RATIO: 2.9 ratio
CHOLESTEROL: 184 mg/dL (ref 0–200)
HDL: 64 mg/dL (ref >40)
LDL Cholesterol: 101 mg/dL — ABNORMAL HIGH (ref 0–99)
Triglycerides: 96 mg/dL (ref <150)
VLDL: 19 mg/dL (ref 0–40)

## 2017-07-18 LAB — ECHOCARDIOGRAM COMPLETE: Weight: 2400 oz

## 2017-07-18 LAB — HEMOGLOBIN A1C
Hgb A1c MFr Bld: 5.1 % (ref 4.8–5.6)
MEAN PLASMA GLUCOSE: 99.67 mg/dL

## 2017-07-18 MED ORDER — ASPIRIN 81 MG PO CHEW: 324.0000 mg | CHEWABLE_TABLET | Freq: Once | ORAL | 0 refills | Status: AC

## 2017-07-18 NOTE — Progress Notes (Signed)
Subjective: Patient agitated and uncooperative.  Does not want to be bothered.  Otherwise at baseline.  Wife repots that left hand fisting has been present for at least a year.    Objective: Current vital signs: BP (!) 183/70 (BP Location: Left Arm)   Pulse 79   Temp 97.6 F (36.4 C) (Oral)   Resp 20   Wt 68 kg (150 lb)   SpO2 96%   BMI 22.15 kg/m  Vital signs in last 24 hours: Temp:  [97.5 F (36.4 C)-98.2 F (36.8 C)] 97.6 F (36.4 C) (10/02 0847) Pulse Rate:  [79-96] 79 (10/02 0847) Resp:  [12-20] 20 (10/02 0847) BP: (122-183)/(45-70) 183/70 (10/02 0847) SpO2:  [96 %-100 %] 96 % (10/02 0847) FiO2 (%):  [21 %] 21 % (10/01 1253)  Intake/Output from previous day: 10/01 0701 - 10/02 0700 In: 240 [P.O.:240] Out: -  Intake/Output this shift: No intake/output data recorded. Nutritional status: DIET DYS 3 Room service appropriate? Yes with Assist; Fluid consistency: Thin  Neurologic Exam: Mental Status: Alert.  Agitated.  Uncooperative. Cranial Nerves: II: Discs flat bilaterally; Visual fields grossly normal, pupils equal, round, reactive to light and accommodation III,IV, VI: ptosis not present, extra-ocular motions intact bilaterally V,VII: grimace symmetric, facial light touch sensation normal bilaterally VIII: hearing decreased bilaterally IX,X: gag reflex present XI: bilateral shoulder shrug XII: midline tongue extension Motor: Moves all extremities but does not cooperate for formal testing.  Left hand fisting.     Lab Results: Basic Metabolic Panel:  Recent Labs Lab 07/17/17 0959  NA 137  K 4.1  CL 101  CO2 28  GLUCOSE 98  BUN 20  CREATININE 1.17  CALCIUM 10.5*    Liver Function Tests:  Recent Labs Lab 07/17/17 0959  AST 24  ALT 15*  ALKPHOS 60  BILITOT 0.5  PROT 7.4  ALBUMIN 4.3   No results for input(s): LIPASE, AMYLASE in the last 168 hours. No results for input(s): AMMONIA in the last 168 hours.  CBC:  Recent Labs Lab  07/17/17 0959  WBC 5.7  NEUTROABS 3.5  HGB 13.0  HCT 38.7*  MCV 88.3  PLT 293    Cardiac Enzymes:  Recent Labs Lab 07/17/17 0959  TROPONINI <0.03    Lipid Panel: No results for input(s): CHOL, TRIG, HDL, CHOLHDL, VLDL, LDLCALC in the last 168 hours.  CBG:  Recent Labs Lab 07/17/17 1000  GLUCAP 88    Microbiology: Results for orders placed or performed during the hospital encounter of 05/23/16  MRSA PCR Screening     Status: None   Collection Time: 05/24/16 12:07 PM  Result Value Ref Range Status   MRSA by PCR NEGATIVE NEGATIVE Final    Comment:        The GeneXpert MRSA Assay (FDA approved for NASAL specimens only), is one component of a comprehensive MRSA colonization surveillance program. It is not intended to diagnose MRSA infection nor to guide or monitor treatment for MRSA infections.     Coagulation Studies:  Recent Labs  07/17/17 0959  LABPROT 13.0  INR 0.99    Imaging: Ct Head Wo Contrast  Result Date: 07/17/2017 CLINICAL DATA:  83 year old male with right-sided facial droop and right arm weakness. History of dementia. Initial encounter. EXAM: CT HEAD WITHOUT CONTRAST TECHNIQUE: Contiguous axial images were obtained from the base of the skull through the vertex without intravenous contrast. COMPARISON:  07/04/2016 CT. FINDINGS: Brain: No intracranial hemorrhage or CT evidence of large acute infarct. Moderate-to-marked chronic microvascular changes. Global  atrophy. No intracranial mass lesion noted on this unenhanced exam. Vascular: Vascular calcifications. Skull: No acute abnormality. Sinuses/Orbits: No acute orbital abnormality. Partial opacification ethmoid sinus air cells. Other: Negative. IMPRESSION: No intracranial hemorrhage or CT evidence of large acute infarct. Moderate-to-marked chronic microvascular changes. Global atrophy. Electronically Signed   By: Lacy Duverney M.D.   On: 07/17/2017 10:29   Mr Brain Limited Wo Contrast  Result  Date: 07/17/2017 CLINICAL DATA:  History of dementia and seizures. Development of right facial droop and right upper extremity weakness. EXAM: MRI HEAD WITHOUT CONTRAST TECHNIQUE: Multiplanar, multiecho pulse sequences of the brain and surrounding structures were obtained without intravenous contrast. COMPARISON:  Head CT same day.  MRI 05/24/2016. FINDINGS: Brain: Diffusion imaging does not show any acute or subacute infarction. Generalized atrophy and chronic small-vessel ischemic changes re- demonstrated. Fall exam not performed. No evidence hydrocephalus or extra-axial collection. No sign of mass lesion. Vascular: Not evaluated with requested sequences. Skull and upper cervical spine: Negative Sinuses/Orbits: Negative Other: None IMPRESSION: Negative diffusion only examination. No sign of acute or subacute infarction. Atrophy and chronic small-vessel ischemic changes. Electronically Signed   By: Paulina Fusi M.D.   On: 07/17/2017 12:32    Medications:  I have reviewed the patient's current medications. Scheduled: . aspirin  324 mg Oral Once  . aspirin  300 mg Rectal Daily   Or  . aspirin  325 mg Oral Daily  . atorvastatin  40 mg Oral q1800  . busPIRone  7.5 mg Oral TID  . enoxaparin (LOVENOX) injection  40 mg Subcutaneous Q24H  . feeding supplement  1 Container Oral BID WC  . fenofibrate  160 mg Oral QPM  . lacosamide  50 mg Oral BID  . LORazepam  0.5 mg Oral BID  . pantoprazole  40 mg Oral BID AC  . sucralfate  1 g Oral QID    Assessment/Plan: Patient difficult to assess today due to agitation.  MRI limited but shows no acute lesions on diffusion weighted images.  On no antiplatelet therapy as an outpatient.  Echocardiogram technically difficult but may suggest an atrial thrombus.  In terms of a TIA work up will not be aggressive due to patient's limitations secondary to his dementia.  This was discussed with wife and she is in agreement.      Recommendations: 1.  ASA daily 2.   Cardiology to see patient and determine appropriate therapy based on echo findings and patient's clinical state.   3.  Continue Vimpat at current dose   LOS: 0 days   Thana Farr, MD Neurology [phone] 07/18/2017  10:38 AM

## 2017-07-18 NOTE — Discharge Summary (Signed)
Benjamin Kim, is a 83 y.o. male  DOB 31-Jul-1932  MRN [id number].  Admission date:  07/17/2017  Admitting Physician  Adrian Saran, MD  Discharge Date:  07/18/2017   Primary MD  Lauro Regulus, MD  Recommendations for primary care physician for things to follow:  Follow-up with PCP in 1 week Admission Diagnosis  Facial droop [R29.810] Right arm weakness [R29.898]   Discharge Diagnosis  Facial droop [R29.810] Right arm weakness [R29.898]    Active Problems:   TIA (transient ischemic attack)      Past Medical History:  Diagnosis Date  . Anxiety   . Asthma   . CKD (chronic kidney disease) stage 3, GFR 30-59 ml/min (HCC)   . Dementia   . Depressed   . Hypercholesteremia   . Hypertension     Past Surgical History:  Procedure Laterality Date  . PROSTATE SURGERY         History of present illness and  Hospital Course:     Kindly see H&P for history of present illness and admission details, please review complete Labs, Consult reports and Test reports for all details in brief  HPI  from the history and physical done on the day of admission 83 year old male patient with chronic kidney disease stage III, advanced dementia comes from [name] for right facial droop, admitted for evaluation of TIA.   Hospital Course  #1. Right facial droop, right Middle weakness: I the time he came to emergency room and comes completely improved. Patient has severe advanced dementia. MRI of the brain did not show acute stroke. Spoke with neurologist, because of his advanced dementia . We will add aspirin and discharge him back to [facility]. Echocardiogram is concerning for questionable vegetations left atrium, Dr. Thayer Ohm End from [facility] called me about the echocardiogram results. I discussed possible TEE,  cardiac CT options with patient's wife, she refused further workup secondary to his advanced dementia. Right now patient has no right facial weakness or right arm numbness. Continue aspirin 81 mg.   2.History of dementia: Continue Effexor, Seroquel and Ativan when necessary. Continue palliative care services at [facility].  3. History of seizure disorder: Continue Vimpat  4. Chronic kidney disease stage III: Creatinine is at baseline    Discharge Condition: stable   Follow UP      Discharge Instructions  and  Discharge Medications      Allergies as of 07/18/2017      Reactions   Ivp Dye [iodinated Diagnostic Agents] Anaphylaxis      Medication List    STOP taking these medications   clindamycin 300 MG capsule Commonly known as:  CLEOCIN     TAKE these medications   aspirin 81 MG chewable tablet Chew 4 tablets (324 mg total) by mouth once.   busPIRone 7.5 MG tablet Commonly known as:  BUSPAR Take 7.5 mg by mouth 3 (three) times daily.   feeding supplement Liqd Take 1 Container by mouth 2 (two) times daily with a meal.   fenofibrate 160 MG tablet Take 160 mg by mouth every evening.   ferrous sulfate 325 (65 FE) MG tablet Take 1 tablet (325 mg total) by mouth 2 (two) times daily with a meal. What changed:  when to take this   ipratropium-albuterol 0.5-2.5 (3) MG/3ML Soln Commonly known as:  DUONEB Take 3 mLs by nebulization every 6 (six) hours as needed. What changed:  additional instructions   lacosamide 50 MG Tabs tablet Commonly  known as:  VIMPAT Take 1 tablet (50 mg total) by mouth 2 (two) times daily.   LORazepam 1 MG tablet Commonly known as:  ATIVAN Take 0.5 mg by mouth 2 (two) times daily.   pantoprazole 40 MG tablet Commonly known as:  PROTONIX Take 1 tablet (40 mg total) by mouth 2 (two) times daily before a meal.   polyethylene glycol packet Commonly known as:  MIRALAX / GLYCOLAX Take 17 g by mouth daily as needed for moderate  constipation or severe constipation.   QUEtiapine 25 MG tablet Commonly known as:  SEROQUEL Take 1 tablet (25 mg total) by mouth at bedtime.   senna-docusate 8.6-50 MG tablet Commonly known as:  Senokot-S Take 2 tablets by mouth 2 (two) times daily.   sucralfate 1 GM/10ML suspension Commonly known as:  CARAFATE Take 10 mLs (1 g total) by mouth 4 (four) times daily -  with meals and at bedtime. What changed:  when to take this   venlafaxine XR 150 MG 24 hr capsule Commonly known as:  EFFEXOR-XR Take 1 capsule (150 mg total) by mouth daily with breakfast. Take two tablets by mouth once daily What changed:  how much to take  additional instructions         Diet and Activity recommendation: See Discharge Instructions above   Consults obtained -neuro  Major procedures and Radiology Reports - PLEASE review detailed and final reports for all details, in brief -      Ct Head Wo Contrast  Result Date: 07/17/2017 CLINICAL DATA:  83 year old male with right-sided facial droop and right arm weakness. History of dementia. Initial encounter. EXAM: CT HEAD WITHOUT CONTRAST TECHNIQUE: Contiguous axial images were obtained from the base of the skull through the vertex without intravenous contrast. COMPARISON:  07/04/2016 CT. FINDINGS: Brain: No intracranial hemorrhage or CT evidence of large acute infarct. Moderate-to-marked chronic microvascular changes. Global atrophy. No intracranial mass lesion noted on this unenhanced exam. Vascular: Vascular calcifications. Skull: No acute abnormality. Sinuses/Orbits: No acute orbital abnormality. Partial opacification ethmoid sinus air cells. Other: Negative. IMPRESSION: No intracranial hemorrhage or CT evidence of large acute infarct. Moderate-to-marked chronic microvascular changes. Global atrophy. Electronically Signed   By: Lacy Duverney M.D.   On: 07/17/2017 10:29   Mr Brain Limited Wo Contrast  Result Date: 07/17/2017 CLINICAL DATA:  History  of dementia and seizures. Development of right facial droop and right upper extremity weakness. EXAM: MRI HEAD WITHOUT CONTRAST TECHNIQUE: Multiplanar, multiecho pulse sequences of the brain and surrounding structures were obtained without intravenous contrast. COMPARISON:  Head CT same day.  MRI 05/24/2016. FINDINGS: Brain: Diffusion imaging does not show any acute or subacute infarction. Generalized atrophy and chronic small-vessel ischemic changes re- demonstrated. Fall exam not performed. No evidence hydrocephalus or extra-axial collection. No sign of mass lesion. Vascular: Not evaluated with requested sequences. Skull and upper cervical spine: Negative Sinuses/Orbits: Negative Other: None IMPRESSION: Negative diffusion only examination. No sign of acute or subacute infarction. Atrophy and chronic small-vessel ischemic changes. Electronically Signed   By: Paulina Fusi M.D.   On: 07/17/2017 12:32    Micro Results   No results found for this or any previous visit (from the past 240 hour(s)).     Today   Subjective:   Benjamin Kim today has Baseline dementia, stable for discharge.  Objective:   Blood pressure (!) 183/70, pulse 79, temperature 97.6 F (36.4 C), temperature source Oral, resp. rate 20, weight 68 kg (150 lb), SpO2 96 %.  Intake/Output Summary (Last 24 hours) at 07/18/17 1011 Last data filed at 07/17/17 1800  Gross per 24 hour  Intake              240 ml  Output                0 ml  Net              240 ml    Exam Awake , Has Baseline dementia [place].AT,PERRAL Supple Neck,No JVD, No cervical lymphadenopathy appriciated.  Symmetrical Chest wall movement, Good air movement bilaterally, CTAB RRR,No Gallops,Rubs or new Murmurs, No Parasternal Heave +ve B.Sounds, Abd Soft, Non tender, No organomegaly appriciated, No rebound -guarding or rigidity. No Cyanosis, Clubbing or edema, No new Rash or bruise  Data Review   CBC w Diff: Lab Results  Component Value Date   WBC  5.7 07/17/2017   HGB 13.0 07/17/2017   HCT 38.7 (L) 07/17/2017   PLT 293 07/17/2017   LYMPHOPCT 21 07/17/2017   MONOPCT 10 07/17/2017   EOSPCT 8 07/17/2017   BASOPCT 1 07/17/2017    CMP: Lab Results  Component Value Date   NA 137 07/17/2017   NA 141 07/23/2015   K 4.1 07/17/2017   CL 101 07/17/2017   CO2 28 07/17/2017   BUN 20 07/17/2017   BUN 22 (A) 07/23/2015   CREATININE 1.17 07/17/2017   GLU 91 07/23/2015   PROT 7.4 07/17/2017   ALBUMIN 4.3 07/17/2017   BILITOT 0.5 07/17/2017   ALKPHOS 60 07/17/2017   AST 24 07/17/2017   ALT 15 (L) 07/17/2017  .   Total Time in preparing paper work, data evaluation and todays exam - 35 minutes  Benjamin Kim M.D on 07/18/2017 at 10:11 AM    Note: This dictation was prepared with Dragon dictation along with smaller phrase technology. Any transcriptional errors that result from this process are unintentional.

## 2017-07-18 NOTE — Progress Notes (Signed)
SLP Cancellation Note  Patient Details Name: Benjamin Kim MRN: [id number] DOB: July 30, 1932   Cancelled treatment:       Reason Eval/Treat Not Completed: SLP screened, no needs identified, will sign off ((no needs in this setting))  Chart reviewed; consulted NSG then wife. Pt is verbally communicating basic wants/needs but is easily agitated and frustrated currently. Pt does have baseline moderate-severe Dementia and is in the memory care unit at [facility]. Due to being out of his usual environment and w/ medical illness, pt's Cognitive status/Dementia could be excacerrbated at this time. It would be beneficial to determine his status (if close to his baseline) w/ f/u in his usual setting at [facility] post discharge. NSG updated; MD agreed.    Jerilynn Som, MS, CCC-SLP Watson,Katherine 07/18/2017, 12:23 PM

## 2017-07-18 NOTE — Progress Notes (Signed)
PT Cancellation Note  Patient Details Name: Benjamin Kim MRN: [id number] DOB: August 14, 1932   Cancelled Treatment:    Reason Eval/Treat Not Completed: Patient declined, no reason specified.  Pt adamantly refusing to work with any of the hospital staff including this PT.  Wife and this PT attempt to encourage pt to attempt OOB activity and provided education on the benefits of mobility; however, pt continues to refuse.  Will continue to follow acutely, and will attempt to see pt later today, schedule permitting.   Encarnacion Chu PT, DPT 07/18/2017, 9:55 AM

## 2017-07-18 NOTE — Care Management Note (Signed)
Case Management Note  Patient Details  Name: Benjamin Kim MRN: [id number] Date of Birth: [birth year]  Subjective/Objective:   Admitted to [facility] under observation status with the diagnosis of TIA. Lives at [facility]. Wife is Johnny Bridge [phone] or [phone]). Last seen Dr. Dareen Piano about 8 months ago. Does see [facility] [employer] [facility] at the facility. Needs help with bathing and dressing. Can feed himself, but eats more, if feed. No home Health. No home oxygen. No skilled nursing in the past. Uses rolling walker to go to the dining room per wife.                   Action/Plan: Will continue to follow for discharge needs   Expected Discharge Date:  07/18/17               Expected Discharge Plan:     In-House Referral:     Discharge planning Services     Post Acute Care Choice:    Choice offered to:     DME Arranged:    DME Agency:     HH Arranged:    HH Agency:     Status of Service:     If discussed at [employer], dates discussed:    Additional Comments:  Gwenette Greet, RN MSN CCM Care Management [phone] 07/18/2017, 10:21 AM

## 2017-07-18 NOTE — Care Management Obs Status (Signed)
MEDICARE OBSERVATION STATUS NOTIFICATION   Patient Details  Name: Benjamin Kim MRN: [id number] Date of Birth: 1932-10-09   Medicare Observation Status Notification Given:  Yes    Gwenette Greet, RN 07/18/2017, 10:21 AM

## 2017-07-18 NOTE — Clinical Social Work Note (Signed)
Patient to be d/c'ed today to [facility]. [facility] and family agreeable to plans will transport via wife's car RN to call report.  Windell Moulding, MSW, Theresia Majors [phone]

## 2017-07-18 NOTE — Evaluation (Signed)
Clinical/Bedside Swallow Evaluation Patient Details  Name: Benjamin Kim MRN: [id number] Date of Birth: 1932-02-21  Today's Date: 07/18/2017 Time: SLP Start Time (ACUTE ONLY): 1200 SLP Stop Time (ACUTE ONLY): 1300 SLP Time Calculation (min) (ACUTE ONLY): 60 min  Past Medical History:  Past Medical History:  Diagnosis Date  . Anxiety   . Asthma   . CKD (chronic kidney disease) stage 3, GFR 30-59 ml/min (HCC)   . Dementia   . Depressed   . Hypercholesteremia   . Hypertension    Past Surgical History:  Past Surgical History:  Procedure Laterality Date  . PROSTATE SURGERY     HPI:  83 y.o. male with a history of dementia and seizures who per report of staff at his facility had right facial droop and right upper extremity weakness.  Patient was sent in for evaluation at that time.  By the time the patient arrived at the ED, he appeared at baseline. CT and MRI both negative. Pt is verbally communicating basic wants/needs but is easily agitated and frustrated currently. Pt does have baseline moderate-severe Dementia and is in the memory care unit at [facility]. Due to being out of his usual environment and w/ medical illness, pt's Cognitive status/Dementia could be excacerrbated at this time. It would be beneficial to determine his status (if close to his baseline) w/ f/u in his usual setting at [facility] post discharge.  Assessment / Plan / Recommendation Clinical Impression  Pt appears to present w/ mild oral phase Dysphagia and can be at an increased risk of aspiration. Pt has baseline diagnosis of Dementia which can impact oral phase swallow function and attention to tasks. Pt's pharyngeal phase swallow function appeared to be grossly [facility].  Pt's wife was present throughout the evaluation and fed the pt d/t pt being easily frustrated by hospital staff.  Pt was given trials of thin liquid (via cup and straw), puree, and soft solid. During the oral phase, pt exhibited timely A-P transit  and adequate oral clearing. Pt had timely mastication during the trials of soft solids, however, wife reported that pt has prolonged mastication at times (regular diet at residence). No overt, immediate s/s of aspiration were noted during the trials of po's given.  No decline in respiratory status or vocal quality were noted. The pt required moderate encouragement from wife to eat/drink throughout his lunch meal - wife stated this is even so at ALF. Wife fed pt from a spoon for puree and soft solid trials, and pt independently drank from cup and straw during thin liquid trials. Pt was easily distracted and agitated by ST being in the room could be d/t Cognitive decline - attempted to refrain from much engagement during the meal to decrease pt's agitation. ST educated wife on general aspiration precautions - sitting upright, reducing distractions, small bites/sips, and lingual clearing - and to monitor respiratory status during meals.  D/t wife's report of oral phase deficits and Cognitive decline, Recommend Dysphagia level 3 diet w/ thin liquids' aspiration precautions.  Recommend moderate assist of set up and supervision during meals (limiting food choices) and encouragement.  ST services will be available for further pt/family education while admitted. MD/NSG updated.   SLP Visit Diagnosis: Dysphagia, oral phase (R13.11)    Aspiration Risk   (increased risk)    Diet Recommendation   Dysphagia level 3 w/ thin liquids; aspiration precautions.   Medication Administration: Crushed with puree    Other  Recommendations Recommended Consults:  (dietician f/u) Oral  Care Recommendations: Oral care BID;Staff/trained caregiver to provide oral care   Follow up Recommendations Skilled Nursing facility      Frequency and Duration min 2x/week  1 week       Prognosis Prognosis for Safe Diet Advancement: Fair (-Good) Barriers to Reach Goals: Cognitive deficits;Behavior (baseline Dementia)      Swallow  Study   General Date of Onset: 07/18/17 HPI: 83 y.o. male with a history of dementia and seizures who per report of staff at his facility had right facial droop and right upper extremity weakness.  Patient was sent in for evaluation at that time.  By the time the patient arrived at the ED, he appeared at baseline. CT and MRI both negative.  Type of Study: Bedside Swallow Evaluation Previous Swallow Assessment: none reported Diet Prior to this Study: Dysphagia 3 (soft);Thin liquids Temperature Spikes Noted: No (wbc WNL) Respiratory Status: Room air History of Recent Intubation: No Behavior/Cognition: Alert;Confused;Agitated;Distractible (Easily agitated and distracted by staff; wife fed pt ) Oral Cavity Assessment: Within Functional Limits Oral Care Completed by SLP: Recent completion by staff Oral Cavity - Dentition: Adequate natural dentition Vision: Functional for self-feeding Self-Feeding Abilities: Able to feed self;Needs set up;Total assist;Needs assist (Pt held cup independently, but wife fed pt from spoon) Patient Positioning: Upright in bed Baseline Vocal Quality: Normal Volitional Cough: Strong Volitional Swallow: Able to elicit    Oral/Motor/Sensory Function Overall Oral Motor/Sensory Function: Within functional limits   Ice Chips Ice chips: Not tested   Thin Liquid Thin Liquid: Within functional limits Presentation: Cup;Straw;Self Fed (4 oz tea; 2 oz milk)    Nectar Thick Nectar Thick Liquid: Not tested   Honey Thick Honey Thick Liquid: Not tested   Puree Puree: Within functional limits Presentation: Spoon (Wife fed pt; 8 trials)   Solid   GO   Solid: Within functional limits (pt had 2 trials of chicken salad sandwich) Presentation:  (wife fed )       Nancy Fetter, SLP-Graduate Student Nancy Fetter 07/18/2017,3:02 PM   This information has been reviewed and agreed upon by this supervising clinician.  This patient note, response to treatment and overall  treatment plan has been reviewed and this clinician agrees with the information provided.  07/18/17, 3:32 PM [phone] Jerilynn Som, MS, CCC-SLP

## 2017-07-18 NOTE — Clinical Social Work Note (Signed)
Clinical Social Work Assessment  Patient Details  Name: Benjamin Kim MRN: [id number] Date of Birth: 07/15/1932  Date of referral:  07/18/17               Reason for consult:  Facility Placement                Permission sought to share information with:  Family Supports, Magazine features editor Permission granted to share information::  Yes, Verbal Permission Granted  Name::     Kemond, Amorin [phone]  [phone]   Agency::  [name]  Relationship::     Contact Information:     Housing/Transportation Living arrangements for the past 2 months:  Assisted Living Facility Source of Information:  Spouse Patient Interpreter Needed:  None Criminal Activity/Legal Involvement Pertinent to Current Situation/Hospitalization:  No - Comment as needed Significant Relationships:  Adult Children, Spouse Lives with:  Facility Resident Do you feel safe going back to the place where you live?  Yes Need for family participation in patient care:  Yes (Comment)  Care giving concerns:  Patient is from [facility], patient's wife does not express any concerns about returning back.   Social Worker assessment / plan:  Patient is an 83 year old male who is married and is a resident at [facility].  Patient has dementia, and is alert and oriented x1, CSW spoke to patient's wife to complete assessment.  Patient has been at [facility] for about a year and his wife has been satisfied with the care that is being provided.  Patient's wife expressed that she likes that it is a small setting and the staff treat him well.  Patient's wife is in agreement to having patient return back to ALF, and gave CSW permission to contact ALF to help with transitioning him back to facility.  Patient's wife did not have any other questions or concerns.  Employment status:  Retired Database administrator PT Recommendations:  24 Hour  Supervision Information / Referral to community resources:     Patient/Family's Response to care:  Patient's family are in agreement to having patient return to [facility].  Patient/Family's Understanding of and Emotional Response to Diagnosis, Current Treatment, and Prognosis:  Patient's wife is aware of current treatment plan and prognosis.  Emotional Assessment Appearance:  Appears stated age Attitude/Demeanor/Rapport:    Affect (typically observed):  Appropriate, Calm, Stable Orientation:  Oriented to Self Alcohol / Substance use:  Not Applicable Psych involvement (Current and /or in the community):  No (Comment)  Discharge Needs  Concerns to be addressed:  Care Coordination Readmission within the last 30 days:  No Current discharge risk:  Cognitively Impaired Barriers to Discharge:  No Barriers Identified   Darleene Cleaver, LCSWA 07/18/2017, 1:52 PM

## 2017-07-18 NOTE — Plan of Care (Signed)
Pt is d/ced back to [facility].  Tests were negative for stroke.  Patient refused treatment - refused telemetry, physical therapy.  Would take some medications.  Patient's demeanor very mercurial - at times tearful and at times very irritated.  Wife present, very supportive and helpful.  Called report to [place] at [facility].  Wife transported him to facility.

## 2017-09-05 ENCOUNTER — Encounter: Payer: Self-pay | Admitting: Emergency Medicine

## 2017-09-05 ENCOUNTER — Emergency Department: Payer: Medicare Other

## 2017-09-05 ENCOUNTER — Other Ambulatory Visit: Payer: Self-pay

## 2017-09-05 ENCOUNTER — Emergency Department
Admission: EM | Admit: 2017-09-05 | Discharge: 2017-09-05 | Disposition: A | Discharge status: Home / Self Care | Payer: Medicare Other | Attending: Student in an Organized Health Care Education/Training Program | Admitting: Student in an Organized Health Care Education/Training Program

## 2017-09-05 DIAGNOSIS — Z79899 Other long term (current) drug therapy: Secondary | ICD-10-CM | POA: Insufficient documentation

## 2017-09-05 DIAGNOSIS — I129 Hypertensive chronic kidney disease with stage 1 through stage 4 chronic kidney disease, or unspecified chronic kidney disease: Secondary | ICD-10-CM | POA: Insufficient documentation

## 2017-09-05 DIAGNOSIS — F0390 Unspecified dementia without behavioral disturbance: Secondary | ICD-10-CM | POA: Diagnosis not present

## 2017-09-05 DIAGNOSIS — N183 Chronic kidney disease, stage 3 (moderate): Secondary | ICD-10-CM | POA: Insufficient documentation

## 2017-09-05 DIAGNOSIS — Z8673 Personal history of transient ischemic attack (TIA), and cerebral infarction without residual deficits: Secondary | ICD-10-CM | POA: Diagnosis not present

## 2017-09-05 DIAGNOSIS — R569 Unspecified convulsions: Secondary | ICD-10-CM | POA: Diagnosis not present

## 2017-09-05 DIAGNOSIS — J45909 Unspecified asthma, uncomplicated: Secondary | ICD-10-CM | POA: Diagnosis not present

## 2017-09-05 HISTORY — DX: Unspecified convulsions: R56.9

## 2017-09-05 LAB — COMPREHENSIVE METABOLIC PANEL
ALK PHOS: 55 U/L (ref 38–126)
ALT: 14 U/L — ABNORMAL LOW (ref 17–63)
ANION GAP: 6 (ref 5–15)
AST: 22 U/L (ref 15–41)
Albumin: 3.9 g/dL (ref 3.5–5.0)
BUN: 26 mg/dL — ABNORMAL HIGH (ref 6–20)
CALCIUM: 10.4 mg/dL — AB (ref 8.9–10.3)
CHLORIDE: 102 mmol/L (ref 101–111)
CO2: 27 mmol/L (ref 22–32)
Creatinine, Ser: 1.31 mg/dL — ABNORMAL HIGH (ref 0.61–1.24)
GFR calc non Af Amer: 49 mL/min — ABNORMAL LOW (ref >60)
GFR, EST AFRICAN AMERICAN: 56 mL/min — AB (ref >60)
Glucose, Bld: 104 mg/dL — ABNORMAL HIGH (ref 65–99)
POTASSIUM: 4.2 mmol/L (ref 3.5–5.1)
SODIUM: 135 mmol/L (ref 135–145)
Total Bilirubin: 0.8 mg/dL (ref 0.3–1.2)
Total Protein: 6.9 g/dL (ref 6.5–8.1)

## 2017-09-05 LAB — CBC WITH DIFFERENTIAL/PLATELET
BASOS PCT: 1 %
Basophils Absolute: 0 10*3/uL (ref 0–0.1)
EOS ABS: 0.6 10*3/uL (ref 0–0.7)
EOS PCT: 11 %
HCT: 36.4 % — ABNORMAL LOW (ref 40.0–52.0)
Hemoglobin: 11.9 g/dL — ABNORMAL LOW (ref 13.0–18.0)
LYMPHS ABS: 1.1 10*3/uL (ref 1.0–3.6)
Lymphocytes Relative: 20 %
MCH: 29.6 pg (ref 26.0–34.0)
MCHC: 32.8 g/dL (ref 32.0–36.0)
MCV: 90.1 fL (ref 80.0–100.0)
MONO ABS: 0.6 10*3/uL (ref 0.2–1.0)
MONOS PCT: 11 %
Neutro Abs: 3.2 10*3/uL (ref 1.4–6.5)
Neutrophils Relative %: 57 %
PLATELETS: 270 10*3/uL (ref 150–440)
RBC: 4.04 MIL/uL — ABNORMAL LOW (ref 4.40–5.90)
RDW: 13.4 % (ref 11.5–14.5)
WBC: 5.6 10*3/uL (ref 3.8–10.6)

## 2017-09-05 LAB — TROPONIN I: Troponin I: <0.03 ng/mL (ref <0.03)

## 2017-09-05 MED ORDER — LACOSAMIDE 50 MG PO TABS
50.0000 mg | ORAL_TABLET | Freq: Two times a day (BID) | ORAL | Status: DC
  Administered 2017-09-05: 50 mg via ORAL

## 2017-09-05 MED ORDER — LACOSAMIDE 50 MG PO TABS
ORAL_TABLET | ORAL | Status: AC
  Administered 2017-09-05: 50 mg via ORAL
  Filled 2017-09-05: qty 1

## 2017-09-05 NOTE — ED Provider Notes (Signed)
[facility] Emergency Department Provider Note    First MD Initiated Contact with Patient 09/05/17 1128     (approximate)  I have reviewed the triage vital signs and the nursing notes.   HISTORY  Chief Complaint Fall   Level V Caveat:  dementia  HPI Benjamin Kim is a 83 y.o. male with a history of dementia chronic kidney disease and reported history of seizures on Vimpat with recent decrease in his Vimpat dosing to 50 mg twice daily presents with altered mental status after a witnessed seizure-like activity.  This occurred at [facility] today.  Patient was sitting on a couch.  Started having shaking spell and was shortly thereafter drowsy and less responsive halfway on the couch.  This is a new behavior for him.  Denies any fevers.  Denies any chest pain.  No numbness or tingling.  Past Medical History:  Diagnosis Date  . Anxiety   . Asthma   . CKD (chronic kidney disease) stage 3, GFR 30-59 ml/min (HCC)   . Dementia   . Depressed   . Hypercholesteremia   . Hypertension    Family History  Problem Relation Age of Onset  . Lung cancer Father    Past Surgical History:  Procedure Laterality Date  . PROSTATE SURGERY     Patient Active Problem List   Diagnosis Date Noted  . TIA (transient ischemic attack) 07/17/2017  . Seizure (HCC) 05/24/2016  . Protein-calorie malnutrition, severe (HCC) 07/14/2015  . Dementia with behavioral disturbance 07/13/2015  . Depression 07/13/2015  . Anemia 07/12/2015  . Gastrointestinal bleeding 07/12/2015      Prior to Admission medications   Medication Sig Start Date End Date Taking? Authorizing Provider  busPIRone (BUSPAR) 7.5 MG tablet Take 7.5 mg by mouth 3 (three) times daily.    [provider]  feeding supplement (BOOST / RESOURCE BREEZE) LIQD Take 1 Container by mouth 2 (two) times daily with a meal. 07/15/15   Milagros Loll, MD  fenofibrate 160 MG tablet Take 160 mg by mouth every evening.     [provider]  ferrous sulfate 325 (65 FE) MG tablet Take 1 tablet (325 mg total) by mouth 2 (two) times daily with a meal. Patient taking differently: Take 325 mg by mouth 2 (two) times daily before a meal.  07/15/15   Sudini, Srikar, MD  ipratropium-albuterol (DUONEB) 0.5-2.5 (3) MG/3ML SOLN Take 3 mLs by nebulization every 6 (six) hours as needed. Patient taking differently: Take 3 mLs by nebulization every 6 (six) hours as needed. For shortness of breath or wheezing 05/26/16   Alford Highland, MD  lacosamide (VIMPAT) 50 MG TABS tablet Take 1 tablet (50 mg total) by mouth 2 (two) times daily. 05/26/16   Alford Highland, MD  LORazepam (ATIVAN) 1 MG tablet Take 0.5 mg by mouth 2 (two) times daily.    [provider]  pantoprazole (PROTONIX) 40 MG tablet Take 1 tablet (40 mg total) by mouth 2 (two) times daily before a meal. 07/15/15   Sudini, Wardell Heath, MD  polyethylene glycol (MIRALAX / GLYCOLAX) packet Take 17 g by mouth daily as needed for moderate constipation or severe constipation. Patient not taking: Reported on 07/17/2017 07/15/15   Milagros Loll, MD  QUEtiapine (SEROQUEL) 25 MG tablet Take 1 tablet (25 mg total) by mouth at bedtime. Patient not taking: Reported on 07/17/2017 05/26/16   Alford Highland, MD  senna-docusate (SENOKOT-S) 8.6-50 MG tablet Take 2 tablets by mouth 2 (two) times daily.  Patient not taking: Reported on 07/17/2017 07/15/15   Milagros LollSudini, Srikar, MD  sucralfate (CARAFATE) 1 GM/10ML suspension Take 10 mLs (1 g total) by mouth 4 (four) times daily -  with meals and at bedtime. Patient taking differently: Take 1 g by mouth 4 (four) times daily.  07/15/15   Milagros LollSudini, Srikar, MD  venlafaxine XR (EFFEXOR-XR) 150 MG 24 hr capsule Take 1 capsule (150 mg total) by mouth daily with breakfast. Take two tablets by mouth once daily Patient taking differently: Take 300 mg by mouth daily with breakfast.  05/26/16   Alford HighlandWieting, Richard, MD    Allergies Ivp dye [iodinated diagnostic  agents]    Social History Social History   Tobacco Use  . Smoking status: Never Smoker  . Smokeless tobacco: Never Used  Substance Use Topics  . Alcohol use: No    Alcohol/week: 0.0 oz  . Drug use: Not on file    Review of Systems Patient denies headaches, rhinorrhea, blurry vision, numbness, shortness of breath, chest pain, edema, cough, abdominal pain, nausea, vomiting, diarrhea, dysuria, fevers, rashes or hallucinations unless otherwise stated above in HPI. ____________________________________________   PHYSICAL EXAM:  VITAL SIGNS: Vitals:   09/05/17 1139  BP: 135/67  Pulse: 90  Resp: 16  Temp: 97.7 F (36.5 C)  SpO2: 97%    Constitutional: Alert and in no acute distress. Eyes: Conjunctivae are normal.  Head: Atraumatic. Nose: No congestion/rhinnorhea. Mouth/Throat: Mucous membranes are moist.   Neck: No stridor. Painless ROM.  Cardiovascular: Normal rate, regular rhythm. Grossly normal heart sounds.  Good peripheral circulation. Respiratory: Normal respiratory effort.  No retractions. Lungs CTAB. Gastrointestinal: Soft and nontender. No distention. No abdominal bruits. No CVA tenderness. Musculoskeletal: Left upper extremity held in chronic contracture of the distal hand reportedly secondary to arthritis.  No lower extremity tenderness nor edema.  No joint effusions. Neurologic:  Normal speech and language. No gross focal neurologic deficits are appreciated. No facial droop Skin:  Skin is warm, dry and intact. No rash noted. Psychiatric: Mood and affect are normal. Speech and behavior are normal. ____________________________________________   LABS (all labs ordered are listed, but only abnormal results are displayed)  No results found for this or any previous visit (from the past 24 hour(s)). ____________________________________________  EKG My review and personal interpretation at Time: 11:42   Indication: seizure like activity  Rate: 85  Rhythm: sinus  Axis: normal Other: normal intervals, no stemi,  ____________________________________________  RADIOLOGY  I personally reviewed all radiographic images ordered to evaluate for the above acute complaints and reviewed radiology reports and findings.  These findings were personally discussed with the patient.  Please see medical record for radiology report.  ____________________________________________   PROCEDURES  Procedure(s) performed:  Procedures    Critical Care performed: no ____________________________________________   INITIAL IMPRESSION / ASSESSMENT AND PLAN / ED COURSE  Pertinent labs & imaging results that were available during my care of the patient were reviewed by me and considered in my medical decision making (see chart for details).  DDX: seizure, dysrhythmia, dehydration, electrolyte abn, iph, mass,   Benjamin Kim is a 83 y.o. who presents to the ED with chief complaint of witnessed seizure-like episode as described above.  Patient in no acute distress.  Protecting his airway.  Does have a history of seizures.  Based on his age will send for blood work CT imaging and chest x-ray to evaluate for the above differential.  Will give single dose of his oral Vimpat as EMS does  report that he had this recently decreased and that is the most likely cause of his episode.  Clinical Course as of Sep 05 1405  Tue Sep 05, 2017  1357 Patient reassessed.  In no acute distress.  Workup in this ER thus far is reassuring.  Do suspect some component of presentation secondary to recent decreased in his Vimpat.  Patient's wife at bedside and states he is now at current baseline.    [PR]    Clinical Course User Index [PR] Willy Eddyobinson, [place] Noori, MD   Have discussed with the patient and available family all diagnostics and treatments performed thus far and all questions were answered to the best of my ability. The patient demonstrates understanding and agreement with  plan.   ____________________________________________   FINAL CLINICAL IMPRESSION(S) / ED DIAGNOSES  Final diagnoses:  Seizure-like activity (HCC)      NEW MEDICATIONS STARTED DURING THIS VISIT:  This SmartLink is deprecated. Use AVSMEDLIST instead to display the medication list for a patient.   Note:  This document was prepared using Dragon voice recognition software and may include unintentional dictation errors.    Willy Eddyobinson, Emeri Estill, MD 09/05/17 [phone]

## 2017-09-05 NOTE — ED Triage Notes (Signed)
[facility]. EMS report pt was found halfway slid off of couch he was sitting on. EMS performed sternal rub with minimal response from patient. Patient awakened when lifted and placed on stretcher. Pt has hx seizures, recently had vimpat dose reduced. Pt also has dementia and is confused at baseline. Wife reports pt is at baseline mental status now.

## 2017-09-05 NOTE — Discharge Instructions (Signed)
Follow-up with your primary physician regarding need for restarting previous dosage of Vimpat.

## 2017-09-05 NOTE — ED Notes (Signed)
Pt unable to urinate at this time, pt becoming tearful stating he can't.  MD and nurse notified.

## 2017-11-13 ENCOUNTER — Encounter: Payer: Self-pay | Admitting: Internal Medicine

## 2017-11-13 ENCOUNTER — Inpatient Hospital Stay
Admission: EM | Admit: 2017-11-13 | Discharge: 2017-11-15 | DRG: 378 | Disposition: A | Discharge status: Home / Self Care | Payer: Medicare Other | Attending: Internal Medicine | Admitting: Internal Medicine

## 2017-11-13 ENCOUNTER — Other Ambulatory Visit: Payer: Self-pay

## 2017-11-13 DIAGNOSIS — J45909 Unspecified asthma, uncomplicated: Secondary | ICD-10-CM | POA: Diagnosis present

## 2017-11-13 DIAGNOSIS — R000 Tachycardia, unspecified: Secondary | ICD-10-CM | POA: Diagnosis present

## 2017-11-13 DIAGNOSIS — F0280 Dementia in other diseases classified elsewhere without behavioral disturbance: Secondary | ICD-10-CM | POA: Diagnosis present

## 2017-11-13 DIAGNOSIS — D62 Acute posthemorrhagic anemia: Secondary | ICD-10-CM | POA: Diagnosis present

## 2017-11-13 DIAGNOSIS — Z91041 Radiographic dye allergy status: Secondary | ICD-10-CM | POA: Diagnosis not present

## 2017-11-13 DIAGNOSIS — Z8673 Personal history of transient ischemic attack (TIA), and cerebral infarction without residual deficits: Secondary | ICD-10-CM | POA: Diagnosis not present

## 2017-11-13 DIAGNOSIS — R531 Weakness: Secondary | ICD-10-CM

## 2017-11-13 DIAGNOSIS — Z515 Encounter for palliative care: Secondary | ICD-10-CM | POA: Diagnosis present

## 2017-11-13 DIAGNOSIS — R197 Diarrhea, unspecified: Secondary | ICD-10-CM | POA: Diagnosis not present

## 2017-11-13 DIAGNOSIS — N183 Chronic kidney disease, stage 3 (moderate): Secondary | ICD-10-CM | POA: Diagnosis present

## 2017-11-13 DIAGNOSIS — Z7982 Long term (current) use of aspirin: Secondary | ICD-10-CM

## 2017-11-13 DIAGNOSIS — K449 Diaphragmatic hernia without obstruction or gangrene: Secondary | ICD-10-CM | POA: Diagnosis present

## 2017-11-13 DIAGNOSIS — G301 Alzheimer's disease with late onset: Secondary | ICD-10-CM | POA: Diagnosis present

## 2017-11-13 DIAGNOSIS — K922 Gastrointestinal hemorrhage, unspecified: Secondary | ICD-10-CM

## 2017-11-13 DIAGNOSIS — K921 Melena: Principal | ICD-10-CM | POA: Diagnosis present

## 2017-11-13 DIAGNOSIS — D649 Anemia, unspecified: Secondary | ICD-10-CM | POA: Diagnosis not present

## 2017-11-13 DIAGNOSIS — Z66 Do not resuscitate: Secondary | ICD-10-CM | POA: Diagnosis present

## 2017-11-13 DIAGNOSIS — I129 Hypertensive chronic kidney disease with stage 1 through stage 4 chronic kidney disease, or unspecified chronic kidney disease: Secondary | ICD-10-CM | POA: Diagnosis present

## 2017-11-13 DIAGNOSIS — F418 Other specified anxiety disorders: Secondary | ICD-10-CM | POA: Diagnosis present

## 2017-11-13 DIAGNOSIS — E7800 Pure hypercholesterolemia, unspecified: Secondary | ICD-10-CM | POA: Diagnosis present

## 2017-11-13 LAB — COMPREHENSIVE METABOLIC PANEL
ALT: 11 U/L — AB (ref 17–63)
AST: 19 U/L (ref 15–41)
Albumin: 2.8 g/dL — ABNORMAL LOW (ref 3.5–5.0)
Alkaline Phosphatase: 38 U/L (ref 38–126)
Anion gap: 8 (ref 5–15)
BUN: 55 mg/dL — ABNORMAL HIGH (ref 6–20)
CHLORIDE: 100 mmol/L — AB (ref 101–111)
CO2: 23 mmol/L (ref 22–32)
CREATININE: 1.09 mg/dL (ref 0.61–1.24)
Calcium: 9.8 mg/dL (ref 8.9–10.3)
GFR calc non Af Amer: >60 mL/min (ref >60)
Glucose, Bld: 122 mg/dL — ABNORMAL HIGH (ref 65–99)
POTASSIUM: 4.4 mmol/L (ref 3.5–5.1)
SODIUM: 131 mmol/L — AB (ref 135–145)
Total Bilirubin: 0.4 mg/dL (ref 0.3–1.2)
Total Protein: 5.5 g/dL — ABNORMAL LOW (ref 6.5–8.1)

## 2017-11-13 LAB — CBC
HCT: 26.2 % — ABNORMAL LOW (ref 40.0–52.0)
HEMATOCRIT: 27.7 % — AB (ref 40.0–52.0)
HEMOGLOBIN: 9.1 g/dL — AB (ref 13.0–18.0)
Hemoglobin: 8.4 g/dL — ABNORMAL LOW (ref 13.0–18.0)
MCH: 28.6 pg (ref 26.0–34.0)
MCH: 28.3 pg (ref 26.0–34.0)
MCHC: 32.8 g/dL (ref 32.0–36.0)
MCHC: 32.1 g/dL (ref 32.0–36.0)
MCV: 87.3 fL (ref 80.0–100.0)
MCV: 88 fL (ref 80.0–100.0)
PLATELETS: 401 10*3/uL (ref 150–440)
PLATELETS: 333 10*3/uL (ref 150–440)
RBC: 3.17 MIL/uL — AB (ref 4.40–5.90)
RBC: 2.97 MIL/uL — ABNORMAL LOW (ref 4.40–5.90)
RDW: 13 % (ref 11.5–14.5)
RDW: 13.3 % (ref 11.5–14.5)
WBC: 11.7 10*3/uL — ABNORMAL HIGH (ref 3.8–10.6)
WBC: 11.1 10*3/uL — ABNORMAL HIGH (ref 3.8–10.6)

## 2017-11-13 LAB — URINALYSIS, COMPLETE (UACMP) WITH MICROSCOPIC
Bilirubin Urine: NEGATIVE
Glucose, UA: NEGATIVE mg/dL
Hgb urine dipstick: NEGATIVE
KETONES UR: NEGATIVE mg/dL
Leukocytes, UA: NEGATIVE
Nitrite: NEGATIVE
PROTEIN: NEGATIVE mg/dL
Specific Gravity, Urine: 1.021 (ref 1.005–1.030)
pH: 5 (ref 5.0–8.0)

## 2017-11-13 LAB — LIPASE, BLOOD: LIPASE: 22 U/L (ref 11–51)

## 2017-11-13 LAB — GLUCOSE, CAPILLARY: GLUCOSE-CAPILLARY: 119 mg/dL — AB (ref 65–99)

## 2017-11-13 MED ORDER — LORAZEPAM 2 MG/ML IJ SOLN
1.0000 mg | INTRAMUSCULAR | Status: AC
  Administered 2017-11-13: 1 mg via INTRAVENOUS
  Filled 2017-11-13: qty 1

## 2017-11-13 MED ORDER — ACETAMINOPHEN 325 MG PO TABS: 650.0000 mg | ORAL_TABLET | Freq: Four times a day (QID) | ORAL | Status: DC | PRN

## 2017-11-13 MED ORDER — SODIUM CHLORIDE 0.9 % IV BOLUS (SEPSIS)
1000.0000 mL | Freq: Once | INTRAVENOUS | Status: AC
  Administered 2017-11-13: 1000 mL via INTRAVENOUS

## 2017-11-13 MED ORDER — SODIUM CHLORIDE 0.9 % IV SOLN
80.0000 mg | Freq: Once | INTRAVENOUS | Status: AC
  Administered 2017-11-13: 23:00:00 80 mg via INTRAVENOUS
  Filled 2017-11-13: qty 80

## 2017-11-13 MED ORDER — PANTOPRAZOLE SODIUM 40 MG IV SOLR: 40.0000 mg | Freq: Two times a day (BID) | INTRAVENOUS | Status: DC

## 2017-11-13 MED ORDER — POLYETHYLENE GLYCOL 3350 17 G PO PACK: 17.0000 g | PACK | Freq: Every day | ORAL | Status: DC | PRN

## 2017-11-13 MED ORDER — ONDANSETRON HCL 4 MG PO TABS: 4.0000 mg | ORAL_TABLET | Freq: Four times a day (QID) | ORAL | Status: DC | PRN

## 2017-11-13 MED ORDER — ACETAMINOPHEN 650 MG RE SUPP: 650.0000 mg | Freq: Four times a day (QID) | RECTAL | Status: DC | PRN

## 2017-11-13 MED ORDER — SODIUM CHLORIDE 0.9 % IV SOLN: 10.0000 mL/h | Freq: Once | INTRAVENOUS | Status: DC

## 2017-11-13 MED ORDER — SODIUM CHLORIDE 0.9% FLUSH
3.0000 mL | Freq: Two times a day (BID) | INTRAVENOUS | Status: DC
  Administered 2017-11-14 – 2017-11-15 (×3): 3 mL via INTRAVENOUS

## 2017-11-13 MED ORDER — SODIUM CHLORIDE 0.9 % IV BOLUS (SEPSIS): 500.0000 mL | Freq: Once | INTRAVENOUS | Status: DC

## 2017-11-13 MED ORDER — SODIUM CHLORIDE 0.9 % IV SOLN
INTRAVENOUS | Status: DC
  Administered 2017-11-13 – 2017-11-14 (×3): via INTRAVENOUS

## 2017-11-13 MED ORDER — SODIUM CHLORIDE 0.9 % IV SOLN
8.0000 mg/h | INTRAVENOUS | Status: DC
  Administered 2017-11-13 – 2017-11-15 (×3): 8 mg/h via INTRAVENOUS
  Filled 2017-11-13 (×3): qty 80

## 2017-11-13 MED ORDER — ONDANSETRON HCL 4 MG/2ML IJ SOLN: 4.0000 mg | Freq: Four times a day (QID) | INTRAMUSCULAR | Status: DC | PRN

## 2017-11-13 MED ORDER — SODIUM CHLORIDE 0.9 % IV SOLN: Freq: Once | INTRAVENOUS | Status: DC

## 2017-11-13 MED ORDER — LORAZEPAM 2 MG/ML IJ SOLN: 1.0000 mg | Freq: Four times a day (QID) | INTRAMUSCULAR | Status: DC | PRN

## 2017-11-13 NOTE — ED Triage Notes (Signed)
[facility] -  Staff reports that he has had diarrhea today with poor apetitte  Pt normally ambulates with a walker but has been so weak that he has not been able to get out of the bed

## 2017-11-13 NOTE — ED Provider Notes (Signed)
[facility] Emergency Department Provider Note  ____________________________________________   I have reviewed the triage vital signs and the nursing notes.   HISTORY  Chief Complaint Weakness and Diarrhea   History limited by and level 5 caveat due to: Dementia, history obtained from wife   HPI Benjamin Kim is a 84 y.o. male who presents to the emergency department today because of concern for weakness and diarrhea. Per the wife the patient has had diarrhea for the past roughly 4 days. Multiple episodes per day. She has not noticed any blood with the diarrhea. No reports of associated vomiting. The wife states the patient has been having some pain. No history of any intestinal disorders. No history of c.dif. No recent antibiotic use.   Per medical record review patient has a history of dementia.  Past Medical History:  Diagnosis Date  . Anxiety   . Asthma   . CKD (chronic kidney disease) stage 3, GFR 30-59 ml/min (HCC)   . Dementia   . Depressed   . Hypercholesteremia   . Hypertension   . Seizures [facility](HCC)     Patient Active Problem List   Diagnosis Date Noted  . TIA (transient ischemic attack) 07/17/2017  . Seizure (HCC) 05/24/2016  . Protein-calorie malnutrition, severe (HCC) 07/14/2015  . Dementia with behavioral disturbance 07/13/2015  . Depression 07/13/2015  . Anemia 07/12/2015  . Gastrointestinal bleeding 07/12/2015    Past Surgical History:  Procedure Laterality Date  . PROSTATE SURGERY      Prior to Admission medications   Medication Sig Start Date End Date Taking? Authorizing Provider  aspirin 81 MG chewable tablet Chew 81 mg by mouth daily.    [provider]  busPIRone (BUSPAR) 7.5 MG tablet Take 7.5 mg by mouth 3 (three) times daily.    [provider]  feeding supplement (BOOST / RESOURCE BREEZE) LIQD Take 1 Container by mouth 2 (two) times daily with a meal. 07/15/15   Milagros LollSudini, Srikar, MD  fenofibrate 160  MG tablet Take 160 mg by mouth every evening.    [provider]  ferrous sulfate 325 (65 FE) MG tablet Take 1 tablet (325 mg total) by mouth 2 (two) times daily with a meal. 07/15/15   Sudini, Srikar, MD  ipratropium-albuterol (DUONEB) 0.5-2.5 (3) MG/3ML SOLN Take 3 mLs by nebulization every 6 (six) hours as needed. Patient taking differently: Take 3 mLs by nebulization every 6 (six) hours as needed. For shortness of breath or wheezing 05/26/16   Alford HighlandWieting, Richard, MD  lacosamide (VIMPAT) 50 MG TABS tablet Take 1 tablet (50 mg total) by mouth 2 (two) times daily. Patient taking differently: Take 50 mg by mouth daily.  05/26/16   Alford HighlandWieting, Richard, MD  loperamide (IMODIUM) 2 MG capsule Take 2 mg by mouth as needed for diarrhea or loose stools.    [provider]  LORazepam (ATIVAN) 0.5 MG tablet Take 0.5 mg by mouth 2 (two) times daily.    [provider]  pantoprazole (PROTONIX) 40 MG tablet Take 1 tablet (40 mg total) by mouth 2 (two) times daily before a meal. 07/15/15   Sudini, Wardell HeathSrikar, MD  polyethylene glycol (MIRALAX / GLYCOLAX) packet Take 17 g by mouth daily as needed for moderate constipation or severe constipation. 07/15/15   Milagros LollSudini, Srikar, MD  QUEtiapine (SEROQUEL) 25 MG tablet Take 1 tablet (25 mg total) by mouth at bedtime. 05/26/16   Alford HighlandWieting, Richard, MD  senna-docusate (SENOKOT-S) 8.6-50 MG tablet Take 2 tablets by mouth 2 (  two) times daily. Patient not taking: Reported on 07/17/2017 07/15/15   Milagros Loll, MD  sucralfate (CARAFATE) 1 GM/10ML suspension Take 10 mLs (1 g total) by mouth 4 (four) times daily -  with meals and at bedtime. Patient taking differently: Take 1 g by mouth 4 (four) times daily.  07/15/15   Milagros Loll, MD  venlafaxine XR (EFFEXOR-XR) 150 MG 24 hr capsule Take 1 capsule (150 mg total) by mouth daily with breakfast. Take two tablets by mouth once daily Patient taking differently: Take 300 mg by mouth daily with breakfast.  05/26/16   Alford Highland, MD    Allergies Ivp dye [iodinated diagnostic agents]  Family History  Problem Relation Age of Onset  . Lung cancer Father     Social History Social History   Tobacco Use  . Smoking status: Never Smoker  . Smokeless tobacco: Never Used  Substance Use Topics  . Alcohol use: No    Alcohol/week: 0.0 oz  . Drug use: Not on file    Review of Systems Unable to obtain secondary to dementia.   ____________________________________________   PHYSICAL EXAM:  VITAL SIGNS: ED Triage Vitals  Enc Vitals Group     BP 11/13/17 1654 (!) 115/94     Pulse Rate 11/13/17 1644 (!) 109     Resp 11/13/17 1644 16     Temp 11/13/17 1644 98.1 F (36.7 C)     Temp Source 11/13/17 1644 Oral     SpO2 11/13/17 1654 100 %     Weight 11/13/17 1646 145 lb (65.8 kg)     Height 11/13/17 1646 5\' 7"  (1.702 m)   Constitutional: Awake and alert. Not oriented to events. In no distress. Eyes: Conjunctivae are normal.  ENT   Head: Normocephalic and atraumatic.   Nose: No congestion/rhinnorhea.   Mouth/Throat: Mucous membranes are moist.   Neck: No stridor. Hematological/Lymphatic/Immunilogical: No cervical lymphadenopathy. Cardiovascular: Normal rate, regular rhythm.  No murmurs, rubs, or gallops.  Respiratory: Normal respiratory effort without tachypnea nor retractions. Breath sounds are clear and equal bilaterally. No wheezes/rales/rhonchi. Gastrointestinal: Soft and non tender. No rebound. No guarding.  Genitourinary: Deferred Musculoskeletal: Normal range of motion in all extremities. No lower extremity edema. Neurologic:  Demented. Moving all extremities. Sensation grossly intact.  Skin:  Skin is warm, dry and intact. No rash noted. Psychiatric: Mood and affect are normal. Speech and behavior are normal. Patient exhibits appropriate insight and judgment.  ____________________________________________    LABS (pertinent positives/negatives)  Lipase 22 CBC wbc 11.7, hgb  9.1, plt 401 CMP na 131, glu 122, cr 1.09  ____________________________________________   EKG  None  ____________________________________________    RADIOLOGY  None  ____________________________________________   PROCEDURES  Procedures  ____________________________________________   INITIAL IMPRESSION / ASSESSMENT AND PLAN / ED COURSE  Pertinent labs & imaging results that were available during my care of the patient were reviewed by me and considered in my medical decision making (see chart for details).  Patient presented to the emergency department today because of concerns for weakness and diarrhea.  The patient was found to be slightly anemic.  Wife did state that the diarrhea was black and tarry.  Patient was given IV fluids and repeat hemoglobin continued to drop down to 8.4.  This point I do have concerns for GI bleed.  Will start on IV Protonix.  Will plan on admission to the hospital service.  Because plan with patient family.  ____________________________________________   FINAL CLINICAL IMPRESSION(S) / ED DIAGNOSES  Final diagnoses:  Weakness  Diarrhea, unspecified type  Gastrointestinal hemorrhage, unspecified gastrointestinal hemorrhage type  Anemia, unspecified type     Note: This dictation was prepared with Dragon dictation. Any transcriptional errors that result from this process are unintentional     Phineas Semen, MD 11/13/17 2328

## 2017-11-13 NOTE — H&P (Signed)
[facility] [place] at [facility]   PATIENT NAME: Benjamin Kim    MR#:  [id number]  DATE OF BIRTH:  1932-04-07  DATE OF ADMISSION:  11/13/2017  PRIMARY CARE PHYSICIAN: Lauro Regulus, MD   REQUESTING/REFERRING PHYSICIAN: Dr. Derrill Kay  CHIEF COMPLAINT:   Chief Complaint  Patient presents with  . Weakness  . Diarrhea    HISTORY OF PRESENT ILLNESS:  Benjamin Kim  is a 84 y.o. male with a known history of dementia, seizure, hiatal hernia is to the emergency room sent in from his memory care unit with diarrhea, black stools.  Here stool is positive for blood.  Patient is found to be anemic with hemoglobin 8.4 with baseline around 11.  He has history of hiatal hernia.  History obtained from wife.  Mentions that he has had blood in stool in the past and was told this is due to hiatal hernia.  Has had EGD and colonoscopy in the past.  Baby aspirin.  PAST MEDICAL HISTORY:   Past Medical History:  Diagnosis Date  . Anxiety   . Asthma   . CKD (chronic kidney disease) stage 3, GFR 30-59 ml/min (HCC)   . Dementia   . Depressed   . Hypercholesteremia   . Hypertension   . Seizures (HCC)     PAST SURGICAL HISTORY:   Past Surgical History:  Procedure Laterality Date  . PROSTATE SURGERY      SOCIAL HISTORY:   Social History   Tobacco Use  . Smoking status: Never Smoker  . Smokeless tobacco: Never Used  Substance Use Topics  . Alcohol use: No    Alcohol/week: 0.0 oz    FAMILY HISTORY:   Family History  Problem Relation Age of Onset  . Lung cancer Father     DRUG ALLERGIES:   Allergies  Allergen Reactions  . Ivp Dye [Iodinated Diagnostic Agents] Anaphylaxis    REVIEW OF SYSTEMS:   Review of Systems  Unable to perform ROS: Dementia    MEDICATIONS AT HOME:   Prior to Admission medications   Medication Sig Start Date End Date Taking? Authorizing Provider  aspirin 81 MG chewable tablet Chew 81 mg by mouth daily.   Yes [provider]  busPIRone (BUSPAR) 7.5 MG tablet Take 7.5 mg by mouth 3 (three) times daily.   Yes [provider]  feeding supplement (BOOST / RESOURCE BREEZE) LIQD Take 1 Container by mouth 2 (two) times daily with a meal. 07/15/15  Yes Mohammad Granade, Wardell Heath, MD  fenofibrate 160 MG tablet Take 160 mg by mouth every evening.   Yes [provider]  ferrous sulfate 325 (65 FE) MG tablet Take 1 tablet (325 mg total) by mouth 2 (two) times daily with a meal. 07/15/15  Yes Quoc Tome, Wardell Heath, MD  pantoprazole (PROTONIX) 40 MG tablet Take 1 tablet (40 mg total) by mouth 2 (two) times daily before a meal. 07/15/15  Yes Emelina Hinch, Wardell Heath, MD  QUEtiapine (SEROQUEL) 25 MG tablet Take 1 tablet (25 mg total) by mouth at bedtime. 05/26/16  Yes Wieting, Richard, MD  sucralfate (CARAFATE) 1 GM/10ML suspension Take 10 mLs (1 g total) by mouth 4 (four) times daily -  with meals and at bedtime. Patient taking differently: Take 1 g by mouth 4 (four) times daily.  07/15/15  Yes Milagros Loll, MD  venlafaxine XR (EFFEXOR-XR) 150 MG 24 hr capsule Take 1 capsule (150 mg total) by mouth daily with breakfast. Take two tablets by mouth once daily Patient taking  differently: Take 300 mg by mouth daily with breakfast.  05/26/16  Yes Wieting, Richard, MD  ipratropium-albuterol (DUONEB) 0.5-2.5 (3) MG/3ML SOLN Take 3 mLs by nebulization every 6 (six) hours as needed. Patient taking differently: Take 3 mLs by nebulization every 6 (six) hours as needed. For shortness of breath or wheezing 05/26/16   Alford Highland, MD  lacosamide (VIMPAT) 50 MG TABS tablet Take 1 tablet (50 mg total) by mouth 2 (two) times daily. Patient not taking: Reported on 11/13/2017 05/26/16   Alford Highland, MD  loperamide (IMODIUM) 2 MG capsule Take 2 mg by mouth as needed for diarrhea or loose stools.    [provider]  polyethylene glycol (MIRALAX / GLYCOLAX) packet Take 17 g by mouth daily as needed for moderate constipation or severe  constipation. 07/15/15   Milagros Loll, MD  senna-docusate (SENOKOT-S) 8.6-50 MG tablet Take 2 tablets by mouth 2 (two) times daily. Patient not taking: Reported on 07/17/2017 07/15/15   Milagros Loll, MD     VITAL SIGNS:  Blood pressure 111/60, pulse (!) 110, temperature 98.1 F (36.7 C), temperature source Oral, resp. rate 16, height 5\' 7"  (1.702 m), weight 65.8 kg (145 lb), SpO2 100 %.  PHYSICAL EXAMINATION:  Physical Exam  GENERAL:  84 y.o.-year-old patient lying in the bed with no acute distress.  EYES: Pupils equal, round, reactive to light and accommodation. No scleral icterus. Extraocular muscles intact.  HEENT: Head atraumatic, normocephalic. Oropharynx and nasopharynx clear. No oropharyngeal erythema, moist oral mucosa  NECK:  Supple, no jugular venous distention. No thyroid enlargement, no tenderness.  LUNGS: Normal breath sounds bilaterally, no wheezing, rales, rhonchi. No use of accessory muscles of respiration.  CARDIOVASCULAR: S1, S2 normal. No murmurs, rubs, or gallops.  ABDOMEN: Soft, nontender, nondistended. Bowel sounds present. No organomegaly or mass.  EXTREMITIES: No pedal edema, cyanosis, or clubbing. + 2 pedal & radial pulses b/l.   NEUROLOGIC: Cranial nerves II through XII are intact. No focal Motor or sensory deficits appreciated b/l PSYCHIATRIC: The patient is pleasantly confused.  Anxious. SKIN: No obvious rash, lesion, or ulcer.   LABORATORY PANEL:   CBC Recent Labs  Lab 11/13/17 1932  WBC 11.1*  HGB 8.4*  HCT 26.2*  PLT 333   ------------------------------------------------------------------------------------------------------------------  Chemistries  Recent Labs  Lab 11/13/17 1648  NA 131*  K 4.4  CL 100*  CO2 23  GLUCOSE 122*  BUN 55*  CREATININE 1.09  CALCIUM 9.8  AST 19  ALT 11*  ALKPHOS 38  BILITOT 0.4   ------------------------------------------------------------------------------------------------------------------  Cardiac  Enzymes No results for input(s): TROPONINI in the last 168 hours. ------------------------------------------------------------------------------------------------------------------  RADIOLOGY:  No results found.   IMPRESSION AND PLAN:   *Upper GI bleed with melena. With associated acute blood loss anemia Baseline hemoglobin seems to be close to 11 and today to 8.4.  Keep patient n.p.o. except medications.  Start Protonix IV.  Serial hemoglobin check.  Consult GI.  Mild tachycardia.  We will bolus normal saline stat. Type and screen.  Transfuse if symptomatic or any worsening hemoglobin. Hold aspirin.  *Asthma.  No exacerbation.  Continue nebulizers as needed  *Dementia.  Monitor for inpatient delirium. Added Ativan as needed  All the records are reviewed and case discussed with ED provider. Management plans discussed with the patient, family and they are in agreement.  CODE STATUS: DNR  TOTAL CC TIME TAKING CARE OF THIS PATIENT: 40 minutes.   Orie Fisherman M.D on 11/13/2017 at 9:22 PM  Between 7am to  6pm - Pager - [phone]  After 6pm go to [email or web address] - password [email or web address]  [facility] [place] [facility]  Office  [phone]  CC: Primary care physician; Lauro RegulusAnderson, Marshall W, MD  Note: This dictation was prepared with Dragon dictation along with smaller phrase technology. Any transcriptional errors that result from this process are unintentional.

## 2017-11-14 ENCOUNTER — Other Ambulatory Visit: Payer: Self-pay

## 2017-11-14 ENCOUNTER — Encounter
Admission: EM | Disposition: A | Discharge status: Home / Self Care | Payer: Self-pay | Source: Home / Self Care | Attending: Internal Medicine

## 2017-11-14 ENCOUNTER — Inpatient Hospital Stay: Payer: Medicare Other | Admitting: Anesthesiology

## 2017-11-14 DIAGNOSIS — K921 Melena: Principal | ICD-10-CM

## 2017-11-14 DIAGNOSIS — R197 Diarrhea, unspecified: Secondary | ICD-10-CM

## 2017-11-14 HISTORY — PX: ESOPHAGOGASTRODUODENOSCOPY (EGD) WITH PROPOFOL: SHX5813

## 2017-11-14 LAB — MRSA PCR SCREENING: MRSA BY PCR: POSITIVE — AB

## 2017-11-14 LAB — CBC
HCT: 23.8 % — ABNORMAL LOW (ref 40.0–52.0)
HEMOGLOBIN: 7.7 g/dL — AB (ref 13.0–18.0)
MCH: 28.5 pg (ref 26.0–34.0)
MCHC: 32.5 g/dL (ref 32.0–36.0)
MCV: 87.5 fL (ref 80.0–100.0)
Platelets: 322 10*3/uL (ref 150–440)
RBC: 2.72 MIL/uL — ABNORMAL LOW (ref 4.40–5.90)
RDW: 13.3 % (ref 11.5–14.5)
WBC: 10.2 10*3/uL (ref 3.8–10.6)

## 2017-11-14 LAB — HEMOGLOBIN AND HEMATOCRIT, BLOOD
HCT: 22.5 % — ABNORMAL LOW (ref 40.0–52.0)
HEMATOCRIT: 22.8 % — AB (ref 40.0–52.0)
Hemoglobin: 7.4 g/dL — ABNORMAL LOW (ref 13.0–18.0)
Hemoglobin: 7.4 g/dL — ABNORMAL LOW (ref 13.0–18.0)

## 2017-11-14 LAB — BASIC METABOLIC PANEL
ANION GAP: 4 — AB (ref 5–15)
BUN: 48 mg/dL — ABNORMAL HIGH (ref 6–20)
CALCIUM: 9.4 mg/dL (ref 8.9–10.3)
CO2: 25 mmol/L (ref 22–32)
Chloride: 105 mmol/L (ref 101–111)
Creatinine, Ser: 1.09 mg/dL (ref 0.61–1.24)
GFR calc Af Amer: >60 mL/min (ref >60)
GFR calc non Af Amer: >60 mL/min (ref >60)
GLUCOSE: 105 mg/dL — AB (ref 65–99)
Potassium: 3.7 mmol/L (ref 3.5–5.1)
Sodium: 134 mmol/L — ABNORMAL LOW (ref 135–145)

## 2017-11-14 SURGERY — ESOPHAGOGASTRODUODENOSCOPY (EGD) WITH PROPOFOL
Anesthesia: General

## 2017-11-14 MED ORDER — BUSPIRONE HCL 15 MG PO TABS
7.5000 mg | ORAL_TABLET | Freq: Three times a day (TID) | ORAL | Status: DC
  Administered 2017-11-14 – 2017-11-15 (×3): 7.5 mg via ORAL
  Filled 2017-11-14 (×6): qty 1

## 2017-11-14 MED ORDER — IPRATROPIUM-ALBUTEROL 0.5-2.5 (3) MG/3ML IN SOLN: 3.0000 mL | Freq: Four times a day (QID) | RESPIRATORY_TRACT | Status: DC | PRN

## 2017-11-14 MED ORDER — LIDOCAINE HCL (CARDIAC) 20 MG/ML IV SOLN
INTRAVENOUS | Status: DC | PRN
Start: 2017-11-14 — End: 2017-11-14
  Administered 2017-11-14: 50 mg via INTRAVENOUS

## 2017-11-14 MED ORDER — MUPIROCIN 2 % EX OINT
1.0000 "application " | TOPICAL_OINTMENT | Freq: Two times a day (BID) | CUTANEOUS | Status: DC
  Administered 2017-11-14 – 2017-11-15 (×3): 1 via NASAL
  Filled 2017-11-14: qty 22

## 2017-11-14 MED ORDER — QUETIAPINE FUMARATE 25 MG PO TABS
25.0000 mg | ORAL_TABLET | Freq: Every day | ORAL | Status: DC
  Administered 2017-11-14 (×2): 25 mg via ORAL
  Filled 2017-11-14 (×2): qty 1

## 2017-11-14 MED ORDER — CHLORHEXIDINE GLUCONATE CLOTH 2 % EX PADS
6.0000 | MEDICATED_PAD | Freq: Every day | CUTANEOUS | Status: DC
  Administered 2017-11-14 – 2017-11-15 (×2): 6 via TOPICAL

## 2017-11-14 MED ORDER — VENLAFAXINE HCL ER 75 MG PO CP24
300.0000 mg | ORAL_CAPSULE | Freq: Every day | ORAL | Status: DC
  Administered 2017-11-14 – 2017-11-15 (×2): 300 mg via ORAL
  Filled 2017-11-14 (×2): qty 4

## 2017-11-14 MED ORDER — LOPERAMIDE HCL 2 MG PO CAPS: 2.0000 mg | ORAL_CAPSULE | ORAL | Status: DC | PRN

## 2017-11-14 MED ORDER — SUCRALFATE 1 GM/10ML PO SUSP
1.0000 g | Freq: Three times a day (TID) | ORAL | Status: DC
  Administered 2017-11-14 – 2017-11-15 (×5): 1 g via ORAL
  Filled 2017-11-14 (×4): qty 10

## 2017-11-14 MED ORDER — PROPOFOL 10 MG/ML IV BOLUS
INTRAVENOUS | Status: DC | PRN
  Administered 2017-11-14: 30 mg via INTRAVENOUS

## 2017-11-14 MED ORDER — PROPOFOL 500 MG/50ML IV EMUL
INTRAVENOUS | Status: DC | PRN
  Administered 2017-11-14: 165 ug/kg/min via INTRAVENOUS

## 2017-11-14 NOTE — Progress Notes (Signed)
[facility] [place] at [facility]   PATIENT NAME: Benjamin CockingKenneth Kim    MR#:  [id number]  DATE OF BIRTH:  1931-12-30  SUBJECTIVE:  CHIEF COMPLAINT: Patient  has no other episodes of black stool or vomiting of blood Daughter at bedside  REVIEW OF SYSTEMS:  Review of system unobtainable as the patient has baseline dementia  DRUG ALLERGIES:   Allergies  Allergen Reactions  . Ivp Dye [Iodinated Diagnostic Agents] Anaphylaxis    VITALS:  Blood pressure 130/83, pulse 99, temperature 98.1 F (36.7 C), temperature source Oral, resp. rate 18, height 5\' 7"  (1.702 m), weight 62 kg (136 lb 11.2 oz), SpO2 96 %.  PHYSICAL EXAMINATION:  GENERAL:  84 y.o.-year-old patient lying in the bed with no acute distress.  EYES: Pupils equal, round, reactive to light and accommodation. No scleral icterus. Extraocular muscles intact.  HEENT: Head atraumatic, normocephalic. Oropharynx and nasopharynx clear.  NECK:  Supple, no jugular venous distention. No thyroid enlargement, no tenderness.  LUNGS: Normal breath sounds bilaterally, no wheezing, rales,rhonchi or crepitation. No use of accessory muscles of respiration.  CARDIOVASCULAR: S1, S2 normal. No murmurs, rubs, or gallops.  ABDOMEN: Soft, nontender, nondistended. Bowel sounds present. EXTREMITIES: No pedal edema, cyanosis, or clubbing.  NEUROLOGIC: Awake and alert, oriented x1-2  pSYCHIATRIC: The patient is alert and oriented x 1-2.  SKIN: No obvious rash, lesion, or ulcer.    LABORATORY PANEL:   CBC Recent Labs  Lab 11/14/17 0544 11/14/17 1135  WBC 10.2  --   HGB 7.7* 7.4*  HCT 23.8* 22.5*  PLT 322  --    ------------------------------------------------------------------------------------------------------------------  Chemistries  Recent Labs  Lab 11/13/17 1648 11/14/17 0544  NA 131* 134*  K 4.4 3.7  CL 100* 105  CO2 23 25  GLUCOSE 122* 105*  BUN 55* 48*  CREATININE 1.09 1.09  CALCIUM 9.8 9.4  AST  19  --   ALT 11*  --   ALKPHOS 38  --   BILITOT 0.4  --    ------------------------------------------------------------------------------------------------------------------  Cardiac Enzymes No results for input(s): TROPONINI in the last 168 hours. ------------------------------------------------------------------------------------------------------------------  RADIOLOGY:  No results found.  EKG:   Orders placed or performed during the hospital encounter of 11/13/17  . ED EKG  . ED EKG    ASSESSMENT AND PLAN:   *Upper GI bleed with melena. Baseline hemoglobin seems to be close to 11 ;  hemoglobin during this admission 8.4-7.7-7.4 n.p.o. except medications.    Protonix IV.   Serial hemoglobin check and transfuse as needed GI is following, for EGD today Mild tachycardia better  With normal saline  Hold aspirin.  Acute blood loss anemia Monitor hemoglobin hematocrit and transfuse as needed  *Asthma.  No exacerbation.  Continue nebulizers as needed  *Dementia.  Monitor for inpatient delirium. Added Ativan as needed       All the records are reviewed and case discussed with Care Management/Social Workerr. Management plans discussed with the patient, family and they are in agreement.  CODE STATUS: dnr  TOTAL TIME TAKING CARE OF THIS PATIENT: 36  minutes.   POSSIBLE D/C IN 1-2 DAYS, DEPENDING ON CLINICAL CONDITION.  Note: This dictation was prepared with Dragon dictation along with smaller phrase technology. Any transcriptional errors that result from this process are unintentional.   Ramonita LabAruna Kristell Wooding M.D on 11/14/2017 at 2:41 PM  Between 7am to 6pm - Pager - [phone] After 6pm go to [email or web address] - password [email or web address]  [facility]  Office  [phone]  CC: Primary care physician; Kirk Ruths, MD

## 2017-11-14 NOTE — Progress Notes (Signed)
Patient s/p endo - tolerated well report called to RN Md into talk with wife -

## 2017-11-14 NOTE — Anesthesia Procedure Notes (Signed)
Date/Time: 11/14/2017 3:30 PM Performed by: Ginger CarneMichelet, Nareg Breighner, CRNA Pre-anesthesia Checklist: Patient identified, Emergency Drugs available, Suction available, Patient being monitored and Timeout performed Patient Re-evaluated:Patient Re-evaluated prior to induction Oxygen Delivery Method: Nasal cannula Preoxygenation: Pre-oxygenation with 100% oxygen

## 2017-11-14 NOTE — Op Note (Signed)
[facility] Gastroenterology Patient Name: Benjamin Kim Procedure Date: 11/14/2017 3:25 PM MRN: [id number] Account #: [id number] Date of Birth: [birth year] Admit Type: Inpatient Age: 84 Room: [facility] ENDO ROOM 4 Gender: Male Note Status: Finalized Procedure:            Upper GI endoscopy Indications:          Melena Providers:            Midge Minium MD, MD Referring MD:         Marya Amsler. Dareen Piano MD, MD (Referring MD) Medicines:            Propofol per Anesthesia Complications:        No immediate complications. Procedure:            Pre-Anesthesia Assessment:                       - Prior to the procedure, a History and Physical was                        performed, and patient medications and allergies were                        reviewed. The patient's tolerance of previous                        anesthesia was also reviewed. The risks and benefits of                        the procedure and the sedation options and risks were                        discussed with the patient. All questions were                        answered, and informed consent was obtained. Prior                        Anticoagulants: The patient has taken no previous                        anticoagulant or antiplatelet agents. ASA Grade                        Assessment: II - A patient with mild systemic disease.                        After reviewing the risks and benefits, the patient was                        deemed in satisfactory condition to undergo the                        procedure.                       After obtaining informed consent, the endoscope was                        passed under direct vision. Throughout the procedure,  the patient's blood pressure, pulse, and oxygen                        saturations were monitored continuously. The Endoscope                        was introduced through the mouth, and advanced to the   second part of duodenum. The upper GI endoscopy was                        accomplished without difficulty. The patient tolerated                        the procedure well. Findings:      A large hiatal hernia was present.      The stomach was normal.      The examined duodenum was normal. Impression:           - Large hiatal hernia.                       - Normal stomach.                       - Normal examined duodenum.                       - No specimens collected. Recommendation:       - Return patient to hospital ward for ongoing care.                       - Resume previous diet.                       - Continue present medications. Procedure Code(s):    --- Professional ---                       [phone], Esophagogastroduodenoscopy, flexible, transoral;                        diagnostic, including collection of specimen(s) by                        brushing or washing, when performed (separate procedure) Diagnosis Code(s):    --- Professional ---                       K92.1, Melena (includes Hematochezia) CPT copyright 2016 American Medical Association. All rights reserved. The codes documented in this report are preliminary and upon coder review may  be revised to meet current compliance requirements. Midge Miniumarren Meenakshi Sazama MD, MD 11/14/2017 3:36:19 PM This report has been signed electronically. Number of Addenda: 0 Note Initiated On: 11/14/2017 3:25 PM      [facility]

## 2017-11-14 NOTE — Plan of Care (Signed)
  Progressing Education: Knowledge of General Education information will improve 11/14/2017 1439 - Progressing by Luretha MurphyMiles, Lajoy Vanamburg, RN Health Behavior/Discharge Planning: Ability to manage health-related needs will improve 11/14/2017 1439 - Progressing by Luretha MurphyMiles, Shelia Kingsberry, RN Clinical Measurements: Ability to maintain clinical measurements within normal limits will improve 11/14/2017 1439 - Progressing by Luretha MurphyMiles, Kree Rafter, RN Will remain free from infection 11/14/2017 1439 - Progressing by Luretha MurphyMiles, Roosevelt Bisher, RN Diagnostic test results will improve 11/14/2017 1439 - Progressing by Luretha MurphyMiles, Akasha Melena, RN Respiratory complications will improve 11/14/2017 1439 - Progressing by Luretha MurphyMiles, Hayes Rehfeldt, RN Cardiovascular complication will be avoided 11/14/2017 1439 - Progressing by Luretha MurphyMiles, Romone Shaff, RN Activity: Risk for activity intolerance will decrease 11/14/2017 1439 - Progressing by Luretha MurphyMiles, Jaxsyn Azam, RN Nutrition: Adequate nutrition will be maintained 11/14/2017 1439 - Progressing by Luretha MurphyMiles, Sparkles Mcneely, RN Coping: Level of anxiety will decrease 11/14/2017 1439 - Progressing by Luretha MurphyMiles, Mellissa Conley, RN Elimination: Will not experience complications related to bowel motility 11/14/2017 1439 - Progressing by Luretha MurphyMiles, Gerica Koble, RN Will not experience complications related to urinary retention 11/14/2017 1439 - Progressing by Luretha MurphyMiles, Blaike Newburn, RN Pain Managment: General experience of comfort will improve 11/14/2017 1439 - Progressing by Luretha MurphyMiles, Alontae Chaloux, RN Safety: Ability to remain free from injury will improve 11/14/2017 1439 - Progressing by Luretha MurphyMiles, Kahner Yanik, RN Skin Integrity: Risk for impaired skin integrity will decrease 11/14/2017 1439 - Progressing by Luretha MurphyMiles, Angelli Baruch, RN

## 2017-11-14 NOTE — Anesthesia Preprocedure Evaluation (Addendum)
Anesthesia Evaluation  Patient identified by MRN, date of birth, ID band Patient confused    Reviewed: Allergy & Precautions, NPO status , Patient's Chart, lab work & pertinent test results  Airway Mallampati: II  TM Distance: <3 FB     Dental  (+) Caps, Chipped   Pulmonary asthma ,           Cardiovascular hypertension, Pt. on medications Normal cardiovascular exam     Neuro/Psych Seizures -,  PSYCHIATRIC DISORDERS Anxiety Depression TIA   GI/Hepatic Neg liver ROS, Melena GI bleed   Endo/Other  negative endocrine ROS  Renal/GU Renal InsufficiencyRenal disease  negative genitourinary   Musculoskeletal negative musculoskeletal ROS (+)   Abdominal Normal abdominal exam  (+)   Peds negative pediatric ROS (+)  Hematology  (+) anemia ,   Anesthesia Other Findings Past Medical History: No date: Anxiety No date: Asthma No date: CKD (chronic kidney disease) stage 3, GFR 30-59 ml/min (HCC) No date: Dementia No date: Depressed No date: Hypercholesteremia No date: Hypertension No date: Seizures (HCC)  Reproductive/Obstetrics                            Anesthesia Physical Anesthesia Plan  ASA: III  Anesthesia Plan: General   Post-op Pain Management:    Induction: Intravenous  PONV Risk Score and Plan:   Airway Management Planned: Nasal Cannula  Additional Equipment:   Intra-op Plan:   Post-operative Plan:   Informed Consent: I have reviewed the patients History and Physical, chart, labs and discussed the procedure including the risks, benefits and alternatives for the proposed anesthesia with the patient or authorized representative who has indicated his/her understanding and acceptance.   Dental advisory given  Plan Discussed with: CRNA and Surgeon  Anesthesia Plan Comments:         Anesthesia Quick Evaluation

## 2017-11-14 NOTE — Progress Notes (Signed)
To endo for egd

## 2017-11-14 NOTE — Transfer of Care (Signed)
Immediate Anesthesia Transfer of Care Note  Patient: Benjamin Kim  Procedure(s) Performed: ESOPHAGOGASTRODUODENOSCOPY (EGD) WITH PROPOFOL (N/A )  Patient Location: PACU  Anesthesia Type:General  Level of Consciousness: sedated  Airway & Oxygen Therapy: Patient Spontanous Breathing and Patient connected to face mask oxygen  Post-op Assessment: Report given to RN and Post -op Vital signs reviewed and stable  Post vital signs: Reviewed and stable  Last Vitals:  Vitals:   11/14/17 1540 11/14/17 1542  BP: (!) 85/32 (!) 88/35  Pulse: 88 85  Resp: 15 16  Temp:  36.9 C  SpO2: 100% 100%    Last Pain:  Vitals:   11/14/17 1449  TempSrc: Tympanic  PainSc:          Complications: No apparent anesthesia complications

## 2017-11-14 NOTE — Anesthesia Postprocedure Evaluation (Signed)
Anesthesia Post Note  Patient: Benjamin Kim  Procedure(s) Performed: ESOPHAGOGASTRODUODENOSCOPY (EGD) WITH PROPOFOL (N/A )  Patient location during evaluation: Endoscopy Anesthesia Type: General Level of consciousness: awake and alert Pain management: pain level controlled Vital Signs Assessment: post-procedure vital signs reviewed and stable Respiratory status: spontaneous breathing, nonlabored ventilation, respiratory function stable and patient connected to nasal cannula oxygen Cardiovascular status: blood pressure returned to baseline and stable Postop Assessment: no apparent nausea or vomiting Anesthetic complications: no     Last Vitals:  Vitals:   11/14/17 1549 11/14/17 1550  BP:  (!) 110/40  Pulse: 83 88  Resp: 16 20  Temp:    SpO2: 100% 100%    Last Pain:  Vitals:   11/14/17 1449  TempSrc: Tympanic  PainSc:                  Nochum Fenter S

## 2017-11-14 NOTE — Consult Note (Signed)
Midge Minium, MD [facility]  [address] [place], [place] [address] Phone: [phone] Fax : [phone]  Consultation  Referring Provider:     Dr. Elpidio Anis Primary Care Physician:  Lauro Regulus, MD Primary Gastroenterologist:  Dr. Marva Panda         Reason for Consultation:     Melena  Date of Admission:  11/13/2017 Date of Consultation:  11/14/2017         HPI:   Benjamin Kim is a 84 y.o. male who has a history of dementia and is in a nursing home. The patient has been seen in the past by Dr. Mechele Collin and Dr. Marva Panda.  The patient had a colonoscopy in 2011 by Dr. Mechele Collin.  There is no report of any recent history of melena but the patient did have some bright red blood per rectum a few months ago.  The patient is presently not able to give any history and is lethargic.  There is no report of any abdominal pain nausea vomiting fevers or chills.  The patient's wife states that the patient has had black stools and diarrhea at the nursing home and it was thought to be better served by being admitted to the hospital.  The patient's hemoglobin back in November was 11.9 with his admission hemoglobin of 9.1 on this admission which is come down to 7.4.  Past Medical History:  Diagnosis Date  . Anxiety   . Asthma   . CKD (chronic kidney disease) stage 3, GFR 30-59 ml/min (HCC)   . Dementia   . Depressed   . Hypercholesteremia   . Hypertension   . Seizures (HCC)     Past Surgical History:  Procedure Laterality Date  . PROSTATE SURGERY      Prior to Admission medications   Medication Sig Start Date End Date Taking? Authorizing Provider  aspirin 81 MG chewable tablet Chew 81 mg by mouth daily.   Yes [provider]  busPIRone (BUSPAR) 7.5 MG tablet Take 7.5 mg by mouth 3 (three) times daily.   Yes [provider]  feeding supplement (BOOST / RESOURCE BREEZE) LIQD Take 1 Container by mouth 2 (two) times daily with a meal. 07/15/15  Yes Sudini, Wardell Heath, MD    fenofibrate 160 MG tablet Take 160 mg by mouth every evening.   Yes [provider]  ferrous sulfate 325 (65 FE) MG tablet Take 1 tablet (325 mg total) by mouth 2 (two) times daily with a meal. 07/15/15  Yes Sudini, Wardell Heath, MD  pantoprazole (PROTONIX) 40 MG tablet Take 1 tablet (40 mg total) by mouth 2 (two) times daily before a meal. 07/15/15  Yes Sudini, Wardell Heath, MD  QUEtiapine (SEROQUEL) 25 MG tablet Take 1 tablet (25 mg total) by mouth at bedtime. 05/26/16  Yes Wieting, Richard, MD  sucralfate (CARAFATE) 1 GM/10ML suspension Take 10 mLs (1 g total) by mouth 4 (four) times daily -  with meals and at bedtime. Patient taking differently: Take 1 g by mouth 4 (four) times daily.  07/15/15  Yes Milagros Loll, MD  venlafaxine XR (EFFEXOR-XR) 150 MG 24 hr capsule Take 1 capsule (150 mg total) by mouth daily with breakfast. Take two tablets by mouth once daily Patient taking differently: Take 300 mg by mouth daily with breakfast.  05/26/16  Yes Wieting, Richard, MD  ipratropium-albuterol (DUONEB) 0.5-2.5 (3) MG/3ML SOLN Take 3 mLs by nebulization every 6 (six) hours as needed. Patient taking differently: Take 3 mLs by nebulization every 6 (  six) hours as needed. For shortness of breath or wheezing 05/26/16   Alford HighlandWieting, Richard, MD  lacosamide (VIMPAT) 50 MG TABS tablet Take 1 tablet (50 mg total) by mouth 2 (two) times daily. Patient not taking: Reported on 11/13/2017 05/26/16   Alford HighlandWieting, Richard, MD  loperamide (IMODIUM) 2 MG capsule Take 2 mg by mouth as needed for diarrhea or loose stools.    [provider]  polyethylene glycol (MIRALAX / GLYCOLAX) packet Take 17 g by mouth daily as needed for moderate constipation or severe constipation. 07/15/15   Milagros LollSudini, Srikar, MD  senna-docusate (SENOKOT-S) 8.6-50 MG tablet Take 2 tablets by mouth 2 (two) times daily. Patient not taking: Reported on 07/17/2017 07/15/15   Milagros LollSudini, Srikar, MD    Family History  Problem Relation Age of Onset  . Lung cancer  Father      Social History   Tobacco Use  . Smoking status: Never Smoker  . Smokeless tobacco: Never Used  Substance Use Topics  . Alcohol use: No    Alcohol/week: 0.0 oz  . Drug use: Not on file    Allergies as of 11/13/2017 - Review Complete 11/13/2017  Allergen Reaction Noted  . Ivp dye [iodinated diagnostic agents] Anaphylaxis 07/12/2015    Review of Systems:    All systems reviewed and negative except where noted in HPI.   Physical Exam:  Kim signs in last 24 hours: Temp:  [98.1 F (36.7 C)] 98.1 F (36.7 C) (01/28 1644) Pulse Rate:  [99-118] 99 (01/29 0609) Resp:  [13-21] 18 (01/28 2230) BP: (87-131)/(54-102) 130/83 (01/29 0609) SpO2:  [96 %-100 %] 96 % (01/29 0609) Weight:  [136 lb 11.2 oz (62 kg)-145 lb (65.8 kg)] 136 lb 11.2 oz (62 kg) (01/29 0500) Last BM Date: 11/13/17 General:   Lethargic and sleeping  head:  Normocephalic and atraumatic. Eyes:   No icterus.   Conjunctiva pink. PERRLA. Ears: Unable to assess. Neck:  Supple; no masses or thyroidomegaly Lungs: Respirations even and unlabored. Lungs clear to auscultation bilaterally.   No wheezes, crackles, or rhonchi.  Heart:  Regular rate and rhythm;  Without murmur, clicks, rubs or gallops Abdomen:  Soft, nondistended, nontender. Normal bowel sounds. No appreciable masses or hepatomegaly.  No rebound or guarding.  Rectal:  Not performed. Msk:  Symmetrical without gross deformities.    Extremities:  Without edema, cyanosis or clubbing. Neurologic: Sleeping and lethargic Skin:  Intact without significant lesions or rashes. Cervical Nodes:  No significant cervical adenopathy. Psych:   Unable to access.  LAB RESULTS: Recent Labs    11/13/17 1648 11/13/17 1932 11/14/17 0544 11/14/17 1135  WBC 11.7* 11.1* 10.2  --   HGB 9.1* 8.4* 7.7* 7.4*  HCT 27.7* 26.2* 23.8* 22.5*  PLT 401 333 322  --    BMET Recent Labs    11/13/17 1648 11/14/17 0544  NA 131* 134*  K 4.4 3.7  CL 100* 105  CO2 23 25    GLUCOSE 122* 105*  BUN 55* 48*  CREATININE 1.09 1.09  CALCIUM 9.8 9.4   LFT Recent Labs    11/13/17 1648  PROT 5.5*  ALBUMIN 2.8*  AST 19  ALT 11*  ALKPHOS 38  BILITOT 0.4   PT/INR No results for input(s): LABPROT, INR in the last 72 hours.  STUDIES: No results found.    Impression / Plan:   Benjamin Kim is a 84 y.o. y/o male with admission for diarrhea and black stools. The patient has had a drop in his  Hb.  The patient will be set up for a upper endoscopy for today.There explained the risks and benefits to the patient's wife and she agrees with the plan.  Thank you for involving me in the care of this patient.      LOS: 1 day   Midge Minium, MD  11/14/2017, 2:03 PM   Note: This dictation was prepared with Dragon dictation along with smaller phrase technology. Any transcriptional errors that result from this process are unintentional.

## 2017-11-14 NOTE — Anesthesia Post-op Follow-up Note (Signed)
Anesthesia QCDR form completed.        

## 2017-11-15 ENCOUNTER — Encounter: Payer: Self-pay | Admitting: Gastroenterology

## 2017-11-15 DIAGNOSIS — D649 Anemia, unspecified: Secondary | ICD-10-CM

## 2017-11-15 DIAGNOSIS — F0280 Dementia in other diseases classified elsewhere without behavioral disturbance: Secondary | ICD-10-CM

## 2017-11-15 DIAGNOSIS — G301 Alzheimer's disease with late onset: Secondary | ICD-10-CM

## 2017-11-15 LAB — PREPARE RBC (CROSSMATCH)

## 2017-11-15 LAB — CBC
HCT: 21.6 % — ABNORMAL LOW (ref 40.0–52.0)
HEMOGLOBIN: 7.1 g/dL — AB (ref 13.0–18.0)
MCH: 28.9 pg (ref 26.0–34.0)
MCHC: 32.9 g/dL (ref 32.0–36.0)
MCV: 87.8 fL (ref 80.0–100.0)
Platelets: 319 10*3/uL (ref 150–440)
RBC: 2.46 MIL/uL — ABNORMAL LOW (ref 4.40–5.90)
RDW: 13.3 % (ref 11.5–14.5)
WBC: 9 10*3/uL (ref 3.8–10.6)

## 2017-11-15 MED ORDER — FERROUS SULFATE 325 (65 FE) MG PO TABS: 325.0000 mg | ORAL_TABLET | Freq: Two times a day (BID) | ORAL | Status: DC

## 2017-11-15 MED ORDER — ACETAMINOPHEN 325 MG PO TABS: 650.0000 mg | ORAL_TABLET | Freq: Four times a day (QID) | ORAL | Status: AC | PRN

## 2017-11-15 MED ORDER — SODIUM CHLORIDE 0.9 % IV SOLN: Freq: Once | INTRAVENOUS | Status: DC

## 2017-11-15 MED ORDER — BUSPIRONE HCL 7.5 MG PO TABS: 7.5000 mg | ORAL_TABLET | Freq: Three times a day (TID) | ORAL | 0 refills | Status: AC

## 2017-11-15 NOTE — Evaluation (Signed)
Physical Therapy Evaluation Patient Details Name: Benjamin Kim MRN: [id number] DOB: 1931/12/12 Today's Date: 11/15/2017   History of Present Illness  presented to ER secondary to diarrhea, black stools; admitted with upper GIB with melena.  Statust post EGD 1/29 significant for large hiatal hernia.  Current HgB 7.1, pending blood transfusion this date; asymptomatic with vitals stable at this time.  Cleared for participation with evaluation as tolerated.  Clinical Impression  Upon evaluation, patient alert, oriented to self only; follows simple commands with increased time for processing and initiation.  Responds optimally to cuing and interaction from wife.  Bilat UE/LE generally weakened, but functional for basic transfers and gait (at least 3/5), though L hand fixed in fisted position, L wrist moderately flexed (uanble to maintain grasp on RW).  Currently requiring mod assist for bed mobility; min/mod assist for sit/stand, basic transfers and short-distance gait (5') with RW.  Constant assist for balance, walker management and overall safety.  Patient startles easily, especially in response to unexpected handling or weight shift.  Attempts to sit prior to full alignment with seating surface, mod/max assist to guide towards seat during stand to sit transition. Would benefit from skilled PT to address above deficits and promote optimal return to PLOF; Recommend return to memory care unit with therapy services as able (anticipate optimal benefit in familiar environment).     Follow Up Recommendations Home health PT    Equipment Recommendations  Rolling walker with 5" wheels    Recommendations for Other Services       Precautions / Restrictions Precautions Precautions: Fall Precaution Comments: contact iso Restrictions Weight Bearing Restrictions: No      Mobility  Bed Mobility Overal bed mobility: Needs Assistance Bed Mobility: Supine to Sit     Supine to sit: Mod assist      General bed mobility comments: assist for LE management, truncal elevation  Transfers Overall transfer level: Needs assistance Equipment used: Rolling walker (2 wheeled) Transfers: Sit to/from Stand Sit to Stand: Min assist;Mod assist         General transfer comment: increased time for processing/task initiation  Ambulation/Gait Ambulation/Gait assistance: Mod assist;Min assist Ambulation Distance (Feet): 5 Feet Assistive device: Rolling walker (2 wheeled)       General Gait Details: broad BOS, staggered stepping pattern with poor standing balance.  Startles easily; high risk for LOb. Unable to maintain L grasp on RW due to hand position.  Patient releasing RW, reaching for chair and attempting transition to seating surface prior to full alignment with seating surface; mod/max assist to guide towards chair during stand to sit transition;  Stairs            Wheelchair Mobility    Modified Rankin (Stroke Patients Only)       Balance Overall balance assessment: Needs assistance Sitting-balance support: No upper extremity supported;Feet supported Sitting balance-Leahy Scale: Fair     Standing balance support: Bilateral upper extremity supported Standing balance-Leahy Scale: Poor                               Pertinent Vitals/Pain Pain Assessment: No/denies pain    Home Living Family/patient expects to be discharged to:: [name]                      Prior Function Level of Independence: Needs assistance         Comments: Per wife at  bedside, patient typically ambulatory for household distances with RW (assist from staff as needed, not consistently), but has experienced functional decline in past week (using WC as primary mobility).  Patient intermittently feeds self, though wife assists to encourage increased intake.  Patient noted to be holding food/drink in mouth; RN informed/aware-to monitor for appropriateness  of SLP consult.     Hand Dominance   Dominant Hand: Right    Extremity/Trunk Assessment   Upper Extremity Assessment Upper Extremity Assessment: (bilat UEs grossly WFL, except L hand in fisted position (all DIPs in extension) with wrist in flexed position)    Lower Extremity Assessment Lower Extremity Assessment: Generalized weakness(grossly at least 3/5 throughout, difficulty with formal MMT due to cognition. Supports body weight without buckling.)       Communication   Communication: HOH(significant dementia)  Cognition Arousal/Alertness: Awake/alert Behavior During Therapy: (cooperative with increased time and clear understanding of goal/expectations.  Startles easily.) Overall Cognitive Status: History of cognitive impairments - at baseline                                        General Comments      Exercises Other Exercises Other Exercises: rolling bilat, mod assist with hand-over-hand assist for UE/LE placement and position; poor dissociation of trunk and extremities; dep assist for hygiene and clothing management.   Assessment/Plan    PT Assessment Patient needs continued PT services  PT Problem List Decreased strength;Decreased range of motion;Decreased balance;Decreased activity tolerance;Decreased mobility;Decreased coordination;Decreased cognition;Decreased knowledge of use of DME;Decreased safety awareness       PT Treatment Interventions DME instruction;Gait training;Stair training;Functional mobility training;Therapeutic activities;Therapeutic exercise;Balance training;Patient/family education    PT Goals (Current goals can be found in the Care Plan section)  Acute Rehab PT Goals Patient Stated Goal: per wife, to see how he is doing and if he can get up PT Goal Formulation: With patient Time For Goal Achievement: 11/29/17 Potential to Achieve Goals: Fair    Frequency Min 2X/week   Barriers to discharge        Co-evaluation                AM-PAC PT "6 Clicks" Daily Activity  Outcome Measure Difficulty turning over in bed (including adjusting bedclothes, sheets and blankets)?: Unable Difficulty moving from lying on back to sitting on the side of the bed? : Unable Difficulty sitting down on and standing up from a chair with arms (e.g., wheelchair, bedside commode, etc,.)?: Unable Help needed moving to and from a bed to chair (including a wheelchair)?: A Lot Help needed walking in hospital room?: A Lot Help needed climbing 3-5 steps with a railing? : Total 6 Click Score: 8    End of Session Equipment Utilized During Treatment: Gait belt Activity Tolerance: Patient tolerated treatment well Patient left: in bed;with call bell/phone within reach;with chair alarm set;with family/visitor present Nurse Communication: Mobility status PT Visit Diagnosis: Muscle weakness (generalized) (M62.81);Difficulty in walking, not elsewhere classified (R26.2)    Time: 1610-9604 PT Time Calculation (min) (ACUTE ONLY): 27 min   Charges:   PT Evaluation $PT Eval Moderate Complexity: 1 Mod PT Treatments $Therapeutic Activity: 8-22 mins   PT G Codes:   PT G-Codes **NOT FOR INPATIENT CLASS** Functional Assessment Tool Used: AM-PAC 6 Clicks Basic Mobility    Damica Gravlin H. Manson Passey, PT, DPT, NCS 11/15/17, 11:03 AM [phone]

## 2017-11-15 NOTE — Plan of Care (Signed)
Dr. Text to inform of low hemoglobin 7.1.

## 2017-11-15 NOTE — [place] FL2 (Signed)
[place] MEDICAID FL2 LEVEL OF CARE SCREENING TOOL     IDENTIFICATION  Patient Name: Benjamin Kim Birthdate: Apr 04, 1932 Sex: male Admission Date (Current Location): 11/13/2017  [place] and [place] Number:  Chiropodist and Address:  [facility], [address], [place], [place] [address]      Provider Number: [id number]  Attending Physician Name and Address:  Ramonita Lab, MD  Relative Name and Phone Number:       Current Level of Care: Hospital Recommended Level of Care: Memory Care Prior Approval Number:    Date Approved/Denied:   PASRR Number:    Discharge Plan: Other (Comment)(return to memory care: [name])    Current Diagnoses: Patient Active Problem List   Diagnosis Date Noted  . Late onset Alzheimer's disease without behavioral disturbance   . Diarrhea   . Melena 11/13/2017  . TIA (transient ischemic attack) 07/17/2017  . Seizure (HCC) 05/24/2016  . Protein-calorie malnutrition, severe (HCC) 07/14/2015  . Dementia with behavioral disturbance 07/13/2015  . Depression 07/13/2015  . Anemia 07/12/2015  . Gastrointestinal bleeding 07/12/2015    Orientation RESPIRATION BLADDER Height & Weight     Self  Normal Incontinent Weight: 136 lb 11 oz (62 kg) Height:  5\' 7"  (170.2 cm)  BEHAVIORAL SYMPTOMS/MOOD NEUROLOGICAL BOWEL NUTRITION STATUS      Continent Diet(chopped regualr diet)  AMBULATORY STATUS COMMUNICATION OF NEEDS Skin   Supervision Verbally Normal                       Personal Care Assistance Level of Assistance  Bathing, Feeding, Dressing Bathing Assistance: Limited assistance Feeding assistance: Independent Dressing Assistance: Limited assistance     Functional Limitations Info  Sight, Hearing, Speech Sight Info: Adequate Hearing Info: Adequate Speech Info: Adequate    SPECIAL CARE FACTORS FREQUENCY                       Contractures Contractures Info: Not present    Additional  Factors Info  Code Status, Allergies Code Status Info: DNR Allergies Info: Ivp Dye Iodinated Diagnostic Agents    MRSA positive 11/14/17 mrsa pcr +       Current Medications (11/15/2017):  This is the current hospital active medication list Current Facility-Administered Medications  Medication Dose Route Frequency Provider Last Rate Last Dose  . 0.9 %  sodium chloride infusion  10 mL/hr Intravenous Once Phineas Semen, MD      . 0.9 %  sodium chloride infusion   Intravenous Once Phineas Semen, MD      . 0.9 %  sodium chloride infusion   Intravenous Continuous Milagros Loll, MD 75 mL/hr at 11/14/17 2138    . 0.9 %  sodium chloride infusion   Intravenous Once Gouru, Aruna, MD      . acetaminophen (TYLENOL) tablet 650 mg  650 mg Oral Q6H PRN Milagros Loll, MD       Or  . acetaminophen (TYLENOL) suppository 650 mg  650 mg Rectal Q6H PRN Sudini, Wardell Heath, MD      . busPIRone (BUSPAR) tablet 7.5 mg  7.5 mg Oral TID Milagros Loll, MD   7.5 mg at 11/15/17 1119  . Chlorhexidine Gluconate Cloth 2 % PADS 6 each  6 each Topical Q0600 Milagros Loll, MD   6 each at 11/15/17 0546  . ferrous sulfate tablet 325 mg  325 mg Oral BID WC Gouru, Aruna, MD      . ipratropium-albuterol (DUONEB)  0.5-2.5 (3) MG/3ML nebulizer solution 3 mL  3 mL Nebulization Q6H PRN Sudini, Srikar, MD      . loperamide (IMODIUM) capsule 2 mg  2 mg Oral PRN Sudini, Srikar, MD      . LORazepam (ATIVAN) injection 1 mg  1 mg Intravenous Q6H PRN Sudini, Wardell Heath, MD      . mupirocin ointment (BACTROBAN) 2 % 1 application  1 application Nasal BID Milagros Loll, MD   1 application at 11/15/17 1119  . ondansetron (ZOFRAN) tablet 4 mg  4 mg Oral Q6H PRN Milagros Loll, MD       Or  . ondansetron (ZOFRAN) injection 4 mg  4 mg Intravenous Q6H PRN Sudini, Wardell Heath, MD      . pantoprazole (PROTONIX) 80 mg in sodium chloride 0.9 % 250 mL (0.32 mg/mL) infusion  8 mg/hr Intravenous Continuous Phineas Semen, MD 25 mL/hr at 11/15/17 0042 8  mg/hr at 11/15/17 0042  . [START ON 11/17/2017] pantoprazole (PROTONIX) injection 40 mg  40 mg Intravenous Q12H Phineas Semen, MD      . polyethylene glycol (MIRALAX / GLYCOLAX) packet 17 g  17 g Oral Daily PRN Sudini, Wardell Heath, MD      . QUEtiapine (SEROQUEL) tablet 25 mg  25 mg Oral QHS Milagros Loll, MD   25 mg at 11/14/17 2125  . sodium chloride 0.9 % bolus 500 mL  500 mL Intravenous Once Sudini, Srikar, MD      . sodium chloride flush (NS) 0.9 % injection 3 mL  3 mL Intravenous Q12H Milagros Loll, MD   3 mL at 11/15/17 1119  . sucralfate (CARAFATE) 1 GM/10ML suspension 1 g  1 g Oral TID WC & HS Milagros Loll, MD   1 g at 11/15/17 1119  . venlafaxine XR (EFFEXOR-XR) 24 hr capsule 300 mg  300 mg Oral Q breakfast Milagros Loll, MD   300 mg at 11/15/17 1121     Discharge Medications: STOP taking these medications   aspirin 81 MG chewable tablet   senna-docusate 8.6-50 MG tablet Commonly known as:  Senokot-S   sucralfate 1 GM/10ML suspension Commonly known as:  CARAFATE     TAKE these medications   acetaminophen 325 MG tablet Commonly known as:  TYLENOL Take 2 tablets (650 mg total) by mouth every 6 (six) hours as needed for mild pain (or Fever >/= 101).   busPIRone 7.5 MG tablet Commonly known as:  BUSPAR Take 1 tablet (7.5 mg total) by mouth 3 (three) times daily.   feeding supplement Liqd Take 1 Container by mouth 2 (two) times daily with a meal.   fenofibrate 160 MG tablet Take 160 mg by mouth every evening.   ferrous sulfate 325 (65 FE) MG tablet Take 1 tablet (325 mg total) by mouth 2 (two) times daily with a meal.   ipratropium-albuterol 0.5-2.5 (3) MG/3ML Soln Commonly known as:  DUONEB Take 3 mLs by nebulization every 6 (six) hours as needed. What changed:  additional instructions   lacosamide 50 MG Tabs tablet Commonly known as:  VIMPAT Take 1 tablet (50 mg total) by mouth 2 (two) times daily.   loperamide 2 MG capsule Commonly known as:   IMODIUM Take 2 mg by mouth as needed for diarrhea or loose stools.   pantoprazole 40 MG tablet Commonly known as:  PROTONIX Take 1 tablet (40 mg total) by mouth 2 (two) times daily before a meal.   polyethylene glycol packet Commonly known as:  MIRALAX / GLYCOLAX Take 17 g  by mouth daily as needed for moderate constipation or severe constipation.   QUEtiapine 25 MG tablet Commonly known as:  SEROQUEL Take 1 tablet (25 mg total) by mouth at bedtime.   venlafaxine XR 150 MG 24 hr capsule Commonly known as:  EFFEXOR-XR Take 1 capsule (150 mg total) by mouth daily with breakfast. Take two tablets by mouth once daily What changed:    how much to take  additional instructions        Relevant Imaging Results:  Relevant Lab Results:   Additional Information SSN: [id number]   Hospice recommended at facility. MOST form completed   Raye SorrowCoble, Inesha Sow N, LCSW

## 2017-11-15 NOTE — Discharge Instructions (Signed)
Follow-up with primary care physician at the facility in 3-5 days on hospice care to follow-up

## 2017-11-15 NOTE — Consult Note (Signed)
Consultation Note Date: 11/15/2017   Patient Name: Benjamin Kim  DOB: [birth year]  MRN: [id number]  Age / Sex: 84 y.o., male  PCP: Kirk Ruths, MD Referring Physician: Nicholes Mango, MD  Reason for Consultation: Establishing goals of care  HPI/Patient Profile: 84 y.o. male  with past medical history of dementia, TIA, seizure, GI bleeding who was admitted on 11/13/2017 with melena and a 4 gram drop in hemoglobin.   He has been transfused and undergone EGD.  Unfortunately no source of bleeding was located, but the EGD did show a large hiatal hernia.  Clinical Assessment and Goals of Care:  I have reviewed medical records including EPIC notes, labs and imaging, received report from the car team, assessed the patient and then met at the bedside along with  to discuss diagnosis prognosis, GOC, EOL wishes, disposition and options.  I introduced Palliative Medicine as specialized medical care for people living with serious illness. It focuses on providing relief from the symptoms and stress of a serious illness. The goal is to improve quality of life for both the patient and the family.  We discussed a brief life review of the patient.  He is a [place] native.  He was educated at [employer] and [employer].  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  He lives at home with his wife.  They have two sons who both live locally and pitch in to help.  The patient has lived in a memory care unit for the last year and a half.  His wife visits him daily for 5-8 hours at memory care every day.  As far as functional and nutritional status he was walking with a rolling walker until about 5 days ago.  He stopped eating.  He appears to have no desire to eat.  He has suffered with chronic nausea for a long time.  His wife states that at that time his legs became to weak to stand and he began requiring a wheel chair.      We discussed his  current illness and what it means in the larger context of his on-going co-morbidities.  Natural disease trajectory and expectations at EOL were discussed.  Specifically Mr. Sabala has had difficulties with GI bleeding.  He had been undergoing work up for a while with no results.  Further he has advancing dementia.  He and his wife are both of the mind that things should be allowed to happen naturally.  They do not want him to be brought back to the hospital for repeated rehydration or blood transfusions.  He would never want a feeding tube.  We completed a MOST form (Medical Order Scope of Treatment). His wife [name] chose:  DNR, Comfort measures only, Yes antibiotics if needed, No feeding Tube.  She accepts hospice services at the facility for her husband [name].   Questions and concerns were addressed.  The family was encouraged to call with questions or concerns.    Primary Decision Maker:  NEXT OF KIN Wife,  [name]    SUMMARY OF RECOMMENDATIONS    MOST form Completed.  Comfort Measures Only.  Return to Memory Care with [facility].  Code Status/Advance Care Planning: DNR  Psycho-social/Spiritual:   Desire for further Chaplaincy support: yes  Prognosis:  Less than 6 months.  Could be much less if he continues to bleed, or if he continues not to eat or drink enough to support his needs.    Discharge Planning: Memory Care Unit with Hospice      Primary Diagnoses: Present on Admission: . Melena   I have reviewed the medical record, interviewed the patient and family, and examined the patient. The following aspects are pertinent.  Past Medical History:  Diagnosis Date  . Anxiety   . Asthma   . CKD (chronic kidney disease) stage 3, GFR 30-59 ml/min (HCC)   . Dementia   . Depressed   . Hypercholesteremia   . Hypertension   . Seizures ([place])    Social History   Socioeconomic History  . Marital status: Married    Spouse name: None  . Number of children: None  .  Years of education: None  . Highest education level: None  Social Needs  . Financial resource strain: None  . Food insecurity - worry: None  . Food insecurity - inability: None  . Transportation needs - medical: None  . Transportation needs - non-medical: None  Occupational History  . None  Tobacco Use  . Smoking status: Never Smoker  . Smokeless tobacco: Never Used  Substance and Sexual Activity  . Alcohol use: No    Alcohol/week: 0.0 oz  . Drug use: None  . Sexual activity: None  Other Topics Concern  . None  Social History Narrative  . None   Family History  Problem Relation Age of Onset  . Lung cancer Father    Scheduled Meds: . busPIRone  7.5 mg Oral TID  . Chlorhexidine Gluconate Cloth  6 each Topical Q0600  . mupirocin ointment  1 application Nasal BID  . [START ON 11/17/2017] pantoprazole  40 mg Intravenous Q12H  . QUEtiapine  25 mg Oral QHS  . sodium chloride flush  3 mL Intravenous Q12H  . sucralfate  1 g Oral TID WC & HS  . venlafaxine XR  300 mg Oral Q breakfast   Continuous Infusions: . sodium chloride    . sodium chloride    . sodium chloride 75 mL/hr at 11/14/17 2138  . sodium chloride    . pantoprozole (PROTONIX) infusion 8 mg/hr (11/15/17 0042)  . sodium chloride     PRN Meds:.acetaminophen **OR** acetaminophen, ipratropium-albuterol, loperamide, LORazepam, ondansetron **OR** ondansetron (ZOFRAN) IV, polyethylene glycol Allergies  Allergen Reactions  . Ivp Dye [Iodinated Diagnostic Agents] Anaphylaxis   Review of Systems patient demented and unable to give.  Physical Exam  Thin elderly well developed man.  Pleasantly demented. CV tachy Resp no distress Abdomen thin, nt, nd.  Vital Signs: BP (!) 129/59 (BP Location: Right Arm)   Pulse (!) 101   Temp 98.7 F (37.1 C) (Oral)   Resp 18   Ht '5\' 7"'  (1.702 m)   Wt 62 kg (136 lb 11 oz)   SpO2 100%   BMI 21.41 kg/m  Pain Assessment: PAINAD   Pain Score: 0-No pain   SpO2: SpO2: 100 % O2  Device:SpO2: 100 % O2 Flow Rate: .   IO: Intake/output summary: No intake or output data in the 24 hours ending 11/15/17 1208  LBM: Last BM  Date: 11/13/17 Baseline Weight: Weight: 65.8 kg (145 lb) Most recent weight: Weight: 62 kg (136 lb 11 oz)     Palliative Assessment/Data: 20%     Time In: 9:00 Time Out: 10:10 Time Total: 70 min. Greater than 50%  of this time was spent counseling and coordinating care related to the above assessment and plan.  Signed by: Florentina Jenny, PA-C Palliative Medicine Pager: [phone]  Please contact Palliative Medicine Team phone at [phone] for questions and concerns.  For individual provider: See Shea Evans

## 2017-11-15 NOTE — Clinical Social Work Note (Addendum)
Clinical Social Work Assessment  Patient Details  Name: Benjamin Kim MRN: [id number] Date of Birth: Jun 14, 1932  Date of referral:  11/15/17               Reason for consult:  Discharge Planning                Permission sought to share information with:  Case Manager, Facility Medical sales representativeContact Representative, Family Supports Permission granted to share information::     Name::        [facility]  Relationship::  Wife  Contact Information:     Housing/Transportation Living arrangements for the past 2 months:  [facility] Source of Information:  Medical Team, Facility, Spouse Patient Interpreter Needed:  None Criminal Activity/Legal Involvement Pertinent to Current Situation/Hospitalization:  No - Comment as needed Significant Relationships:  Merchandiser, retailCommunity Support, Other Family Members, Spouse Lives with:  Facility Resident Do you feel safe going back to the place where you live?  Yes Need for family participation in patient care:  Yes (Comment)  Care giving concerns:  [facility]  known history of dementia, seizure, hiatal hernia is to the emergency room sent in from his memory care unit with diarrhea, black stools.  Here stool is positive for blood.  Patient is found to be anemic with hemoglobin 8.4 with baseline around 11.  He has history of hiatal hernia.  [facility] with plans to return PT recommending home health and palliative care team has recommended hospice at facility. Patient cannot have both due to conflicting goals, however facility will resume home health with palliative and if patient does not progress they will begin hospice. FL2 updated reflecting recommendations.    Social Worker assessment / plan:  Patient is an 84 year old male who is married and [facility]  Patient has dementia, and is alert and oriented x1, CSW spoke to patient's wife to  complete assessment.  [facility] and his wife has been satisfied with the care that is being provided.  Patient's wife expressed that she likes that it is a small setting and the staff treat him well.  Patient's wife is in agreement to having patient return back to ALF, and gave CSW permission to contact ALF to help with transitioning him back to facility.  Patient's wife did not have any other questions or concerns.   Plans are for return to facility this evening after patient receives blood. Wife will transport by car per her request.   Employment status:  Retired Database administratornsurance information:  Managed Medicare PT Recommendations:  Home with Home Health Information / Referral to community resources:     Patient/Family's Response to care:  Understanding  Patient/Family's Understanding of and Emotional Response to Diagnosis, Current Treatment, and Prognosis:  Patient at baseline is confused. Wife aware of plans and current treatment.    Emotional Assessment Appearance:  Appears stated age Attitude/Demeanor/Rapport:    Affect (typically observed):  Other, Restless(Confused) Orientation:  Oriented to Self Alcohol / Substance use:  Not Applicable Psych involvement (Current and /or in the community):  No (Comment)  Discharge Needs  Concerns to be addressed:  No discharge needs identified Readmission within the last 30 days:  No Current discharge risk:  None Barriers to Discharge:  Continued Medical Work up   Benjamin SorrowCoble, Benjamin Furey N, LCSW 11/15/2017, 12:04 PM

## 2017-11-15 NOTE — Discharge Planning (Signed)
IV removed.  1 unit blood transfusion completed.  RN assessment and VS revealed stability [facility].  Report called and s/w Fleeta EmmerAngel Dooley, RN.  Discharge papers given, explained an educated.  No FU appts or scripts needed at this time.  Once ready, will be wheeled to car and family transporting via car back to facility.

## 2017-11-15 NOTE — [place] FL2 (Signed)
[place] MEDICAID FL2 LEVEL OF CARE SCREENING TOOL     IDENTIFICATION  Patient Name: Benjamin Kim Birthdate: 1932-06-10 Sex: male Admission Date (Current Location): 11/13/2017  [place] and [place] Number:  Chiropodist and Address:  [facility], [address] [place], [place] [address]      Provider Number: [id number]  Attending Physician Name and Address:  Ramonita Lab, MD  Relative Name and Phone Number:       Current Level of Care: Hospital Recommended Level of Care: Memory Care Prior Approval Number:    Date Approved/Denied:   PASRR Number:    Discharge Plan: Other (Comment)(return to memory care: [name])    Current Diagnoses: Patient Active Problem List   Diagnosis Date Noted  . Diarrhea   . Melena 11/13/2017  . TIA (transient ischemic attack) 07/17/2017  . Seizure (HCC) 05/24/2016  . Protein-calorie malnutrition, severe (HCC) 07/14/2015  . Dementia with behavioral disturbance 07/13/2015  . Depression 07/13/2015  . Anemia 07/12/2015  . Gastrointestinal bleeding 07/12/2015    Orientation RESPIRATION BLADDER Height & Weight     Self  Normal Incontinent Weight: 136 lb 11 oz (62 kg) Height:  5\' 7"  (170.2 cm)  BEHAVIORAL SYMPTOMS/MOOD NEUROLOGICAL BOWEL NUTRITION STATUS      Continent Diet(chopped regualr diet)  AMBULATORY STATUS COMMUNICATION OF NEEDS Skin   Supervision Verbally Normal                       Personal Care Assistance Level of Assistance  Bathing, Feeding, Dressing Bathing Assistance: Limited assistance Feeding assistance: Independent Dressing Assistance: Limited assistance     Functional Limitations Info  Sight, Hearing, Speech Sight Info: Adequate Hearing Info: Adequate Speech Info: Adequate    SPECIAL CARE FACTORS FREQUENCY                       Contractures Contractures Info: Not present    Additional Factors Info  Code Status, Allergies Code Status Info:  DNR Allergies Info: Ivp Dye Iodinated Diagnostic Agents    MRSA positive 11/14/17 mrsa pcr +       Current Medications (11/15/2017):  This is the current hospital active medication list Current Facility-Administered Medications  Medication Dose Route Frequency Provider Last Rate Last Dose  . 0.9 %  sodium chloride infusion  10 mL/hr Intravenous Once Phineas Semen, MD      . 0.9 %  sodium chloride infusion   Intravenous Once Phineas Semen, MD      . 0.9 %  sodium chloride infusion   Intravenous Continuous Milagros Loll, MD 75 mL/hr at 11/14/17 2138    . 0.9 %  sodium chloride infusion   Intravenous Once Lavel Rieman, MD      . acetaminophen (TYLENOL) tablet 650 mg  650 mg Oral Q6H PRN Milagros Loll, MD       Or  . acetaminophen (TYLENOL) suppository 650 mg  650 mg Rectal Q6H PRN Sudini, Wardell Heath, MD      . busPIRone (BUSPAR) tablet 7.5 mg  7.5 mg Oral TID Milagros Loll, MD   7.5 mg at 11/15/17 1119  . Chlorhexidine Gluconate Cloth 2 % PADS 6 each  6 each Topical Q0600 Milagros Loll, MD   6 each at 11/15/17 0546  . ipratropium-albuterol (DUONEB) 0.5-2.5 (3) MG/3ML nebulizer solution 3 mL  3 mL Nebulization Q6H PRN Sudini, Srikar, MD      . loperamide (IMODIUM) capsule 2 mg  2 mg  Oral PRN Milagros Loll, MD      . LORazepam (ATIVAN) injection 1 mg  1 mg Intravenous Q6H PRN Sudini, Wardell Heath, MD      . mupirocin ointment (BACTROBAN) 2 % 1 application  1 application Nasal BID Milagros Loll, MD   1 application at 11/15/17 1119  . ondansetron (ZOFRAN) tablet 4 mg  4 mg Oral Q6H PRN Milagros Loll, MD       Or  . ondansetron (ZOFRAN) injection 4 mg  4 mg Intravenous Q6H PRN Sudini, Wardell Heath, MD      . pantoprazole (PROTONIX) 80 mg in sodium chloride 0.9 % 250 mL (0.32 mg/mL) infusion  8 mg/hr Intravenous Continuous Phineas Semen, MD 25 mL/hr at 11/15/17 0042 8 mg/hr at 11/15/17 0042  . [START ON 11/17/2017] pantoprazole (PROTONIX) injection 40 mg  40 mg Intravenous Q12H Phineas Semen, MD       . polyethylene glycol (MIRALAX / GLYCOLAX) packet 17 g  17 g Oral Daily PRN Sudini, Wardell Heath, MD      . QUEtiapine (SEROQUEL) tablet 25 mg  25 mg Oral QHS Milagros Loll, MD   25 mg at 11/14/17 2125  . sodium chloride 0.9 % bolus 500 mL  500 mL Intravenous Once Sudini, Srikar, MD      . sodium chloride flush (NS) 0.9 % injection 3 mL  3 mL Intravenous Q12H Milagros Loll, MD   3 mL at 11/15/17 1119  . sucralfate (CARAFATE) 1 GM/10ML suspension 1 g  1 g Oral TID WC & HS Milagros Loll, MD   1 g at 11/15/17 1119  . venlafaxine XR (EFFEXOR-XR) 24 hr capsule 300 mg  300 mg Oral Q breakfast Milagros Loll, MD   300 mg at 11/15/17 1121     Discharge Medications: STOP taking these medications   aspirin 81 MG chewable tablet   lacosamide 50 MG Tabs tablet Commonly known as:  VIMPAT   senna-docusate 8.6-50 MG tablet Commonly known as:  Senokot-S   sucralfate 1 GM/10ML suspension Commonly known as:  CARAFATE     TAKE these medications   acetaminophen 325 MG tablet Commonly known as:  TYLENOL Take 2 tablets (650 mg total) by mouth every 6 (six) hours as needed for mild pain (or Fever >/= 101).   busPIRone 7.5 MG tablet Commonly known as:  BUSPAR Take 1 tablet (7.5 mg total) by mouth 3 (three) times daily.   feeding supplement Liqd Take 1 Container by mouth 2 (two) times daily with a meal.   fenofibrate 160 MG tablet Take 160 mg by mouth every evening.   ferrous sulfate 325 (65 FE) MG tablet Take 1 tablet (325 mg total) by mouth 2 (two) times daily with a meal.   ipratropium-albuterol 0.5-2.5 (3) MG/3ML Soln Commonly known as:  DUONEB Take 3 mLs by nebulization every 6 (six) hours as needed. What changed:  additional instructions   loperamide 2 MG capsule Commonly known as:  IMODIUM Take 2 mg by mouth as needed for diarrhea or loose stools.   pantoprazole 40 MG tablet Commonly known as:  PROTONIX Take 1 tablet (40 mg total) by mouth 2 (two) times daily before a  meal.   polyethylene glycol packet Commonly known as:  MIRALAX / GLYCOLAX Take 17 g by mouth daily as needed for moderate constipation or severe constipation.   QUEtiapine 25 MG tablet Commonly known as:  SEROQUEL Take 1 tablet (25 mg total) by mouth at bedtime.   venlafaxine XR 150 MG 24 hr capsule Commonly  known as:  EFFEXOR-XR Take 1 capsule (150 mg total) by mouth daily with breakfast. Take two tablets by mouth once daily What changed:    how much to take  additional instructions      Relevant Imaging Results:  Relevant Lab Results:   Additional Information SSN: [id number]   Hospice recommended at facility. MOST form completed   Raye SorrowCoble, Hannah N, LCSW

## 2017-11-15 NOTE — Discharge Summary (Addendum)
[facility] [place] [facility]   PATIENT NAME: Benjamin Kim    MR#:  [id number]  DATE OF BIRTH:  1932/04/16  DATE OF ADMISSION:  11/13/2017 ADMITTING PHYSICIAN: Milagros Loll, MD  DATE OF DISCHARGE: 11/15/17  PRIMARY CARE PHYSICIAN: Lauro Regulus, MD    ADMISSION DIAGNOSIS:  Weakness [R53.1] Gastrointestinal hemorrhage, unspecified gastrointestinal hemorrhage type [K92.2] Anemia, unspecified type [D64.9] Diarrhea, unspecified type [R19.7]  DISCHARGE DIAGNOSIS:  Active Problems:   Melena   Diarrhea   Late onset Alzheimer's disease without behavioral disturbance   SECONDARY DIAGNOSIS:   Past Medical History:  Diagnosis Date  . Anxiety   . Asthma   . CKD (chronic kidney disease) stage 3, GFR 30-59 ml/min (HCC)   . Dementia   . Depressed   . Hypercholesteremia   . Hypertension   . Seizures [facility])     HOSPITAL COURSE:   HPI  Benjamin Kim  is a 84 y.o. male with a known history of dementia, seizure, hiatal hernia is to the emergency room sent in from his memory care unit with diarrhea, black stools.  Here stool is positive for blood.  Patient is found to be anemic with hemoglobin 8.4 with baseline around 11.  He has history of hiatal hernia.  History obtained from wife.  Mentions that he has had blood in stool in the past and was told this is due to hiatal hernia.  Has had EGD and colonoscopy in the past.  Baby aspirin  *Upper GI bleed with melena. Baseline hemoglobin seems to be close to 11 ;  hemoglobin during this admission 8.4-7.7-7.4- 7.1 transfuse 1 more unit of blood Patient had EGD on 11/14/2017.revealed hiatal hernia but no other acute findings.   Okay to discharge patient with PPI from GI standpoint and outpatient follow-up  mild tachycardia better  With normal saline  Hold aspirin.  Acute blood loss anemia  transfused  Iron supplements  *Asthma. No exacerbation. Continue nebulizers as needed  *Dementia. Monitor  for inpatient delirium. Added Ativan as needed   She was seen by palliative care patient is getting discharged to [facility] with hospice care.  DISCHARGE CONDITIONS:   Fair   CONSULTS OBTAINED:  Treatment Team:  Midge Minium, MD   PROCEDURES egd   DRUG ALLERGIES:   Allergies  Allergen Reactions  . Ivp Dye [Iodinated Diagnostic Agents] Anaphylaxis    DISCHARGE MEDICATIONS:   Allergies as of 11/15/2017      Reactions   Ivp Dye [iodinated Diagnostic Agents] Anaphylaxis      Medication List    STOP taking these medications   aspirin 81 MG chewable tablet   senna-docusate 8.6-50 MG tablet Commonly known as:  Senokot-S   sucralfate 1 GM/10ML suspension Commonly known as:  CARAFATE     TAKE these medications   acetaminophen 325 MG tablet Commonly known as:  TYLENOL Take 2 tablets (650 mg total) by mouth every 6 (six) hours as needed for mild pain (or Fever >/= 101).   busPIRone 7.5 MG tablet Commonly known as:  BUSPAR Take 1 tablet (7.5 mg total) by mouth 3 (three) times daily.   feeding supplement Liqd Take 1 Container by mouth 2 (two) times daily with a meal.   fenofibrate 160 MG tablet Take 160 mg by mouth every evening.   ferrous sulfate 325 (65 FE) MG tablet Take 1 tablet (325 mg total) by mouth 2 (two) times daily with a meal.   ipratropium-albuterol 0.5-2.5 (3) MG/3ML Soln Commonly  known as:  DUONEB Take 3 mLs by nebulization every 6 (six) hours as needed. What changed:  additional instructions   lacosamide 50 MG Tabs tablet Commonly known as:  VIMPAT Take 1 tablet (50 mg total) by mouth 2 (two) times daily.   loperamide 2 MG capsule Commonly known as:  IMODIUM Take 2 mg by mouth as needed for diarrhea or loose stools.   pantoprazole 40 MG tablet Commonly known as:  PROTONIX Take 1 tablet (40 mg total) by mouth 2 (two) times daily before a meal.   polyethylene glycol packet Commonly known as:  MIRALAX / GLYCOLAX Take 17 g by mouth  daily as needed for moderate constipation or severe constipation.   QUEtiapine 25 MG tablet Commonly known as:  SEROQUEL Take 1 tablet (25 mg total) by mouth at bedtime.   venlafaxine XR 150 MG 24 hr capsule Commonly known as:  EFFEXOR-XR Take 1 capsule (150 mg total) by mouth daily with breakfast. Take two tablets by mouth once daily What changed:    how much to take  additional instructions        DISCHARGE INSTRUCTIONS:   Follow-up with primary care physician at the facility in 3-5 days on hospice care to follow-up  DIET:  Regular diet  DISCHARGE CONDITION:  Fair  ACTIVITY:  Activity as tolerated  OXYGEN:  Home Oxygen: No.   Oxygen Delivery: room air  DISCHARGE LOCATION:  home   If you experience worsening of your admission symptoms, develop shortness of breath, life threatening emergency, suicidal or homicidal thoughts you must seek medical attention immediately by calling 911 or calling your MD immediately  if symptoms less severe.  You Must read complete instructions/literature along with all the possible adverse reactions/side effects for all the Medicines you take and that have been prescribed to you. Take any new Medicines after you have completely understood and accpet all the possible adverse reactions/side effects.   Please note  You were cared for by a hospitalist during your hospital stay. If you have any questions about your discharge medications or the care you received while you were in the hospital after you are discharged, you can call the unit and asked to speak with the hospitalist on call if the hospitalist that took care of you is not available. Once you are discharged, your primary care physician will handle any further medical issues. Please note that NO REFILLS for any discharge medications will be authorized once you are discharged, as it is imperative that you return to your primary care physician (or establish a relationship with a primary  care physician if you do not have one) for your aftercare needs so that they can reassess your need for medications and monitor your lab values.     Today  Chief Complaint  Patient presents with  . Weakness  . Diarrhea   Patient did not have any other episodes of bleeding.  Will transfuse 1 unit of blood today and discharge to [facility] with hospice care.  Okay to discharge patient from GI standpoint  ROS:  Patient is demented at his baseline  VITAL SIGNS:  Blood pressure 134/64, pulse (!) 108, temperature 98.2 F (36.8 C), resp. rate 16, height 5\' 7"  (1.702 m), weight 62 kg (136 lb 11 oz), SpO2 100 %.  I/O:    Intake/Output Summary (Last 24 hours) at 11/15/2017 1510 Last data filed at 11/15/2017 1332 Gross per 24 hour  Intake 1660 ml  Output -  Net 1660 ml  PHYSICAL EXAMINATION:  GENERAL:  84 y.o.-year-old patient lying in the bed with no acute distress.  EYES: Pupils equal, round, reactive to light and accommodation. No scleral icterus. Extraocular muscles intact.  HEENT: Head atraumatic, normocephalic. Oropharynx and nasopharynx clear.  NECK:  Supple, no jugular venous distention. No thyroid enlargement, no tenderness.  LUNGS: Normal breath sounds bilaterally, no wheezing, rales,rhonchi or crepitation. No use of accessory muscles of respiration.  CARDIOVASCULAR: S1, S2 normal. No murmurs, rubs, or gallops.  ABDOMEN: Soft, non-tender, non-distended. Bowel sounds present.  EXTREMITIES: No pedal edema, cyanosis, or clubbing.  NEUROLOGIC: Cranial nerves II through XII are intact. Muscle strength 5/5 in all extremities. Sensation intact. Gait not checked.  PSYCHIATRIC: The patient is alert and oriented x 3.  SKIN: No obvious rash, lesion, or ulcer.   DATA REVIEW:   CBC Recent Labs  Lab 11/15/17 0349  WBC 9.0  HGB 7.1*  HCT 21.6*  PLT 319    Chemistries  Recent Labs  Lab 11/13/17 1648 11/14/17 0544  NA 131* 134*  K 4.4 3.7  CL 100* 105  CO2 23 25   GLUCOSE 122* 105*  BUN 55* 48*  CREATININE 1.09 1.09  CALCIUM 9.8 9.4  AST 19  --   ALT 11*  --   ALKPHOS 38  --   BILITOT 0.4  --     Cardiac Enzymes No results for input(s): TROPONINI in the last 168 hours.  Microbiology Results  Results for orders placed or performed during the hospital encounter of 11/13/17  MRSA PCR Screening     Status: Abnormal   Collection Time: 11/14/17  1:31 AM  Result Value Ref Range Status   MRSA by PCR POSITIVE (A) NEGATIVE Final    Comment:        The GeneXpert MRSA Assay (FDA approved for NASAL specimens only), is one component of a comprehensive MRSA colonization surveillance program. It is not intended to diagnose MRSA infection nor to guide or monitor treatment for MRSA infections. RESULT CALLED TO, READ BACK BY AND VERIFIED WITH: JEANNE MUTESI AT 0344 ON 11/14/17 [facility]. Performed at [facility], [address] [place], [place] [address]     RADIOLOGY:  No results found.  EKG:   Orders placed or performed during the hospital encounter of 11/13/17  . ED EKG  . ED EKG      Management plans discussed with the patient, family and they are in agreement.  CODE STATUS:     Code Status Orders  (From admission, onward)        Start     Ordered   11/13/17 2121  Do not attempt resuscitation (DNR)  Continuous    Question Answer Comment  In the event of cardiac or respiratory ARREST Do not call a "code blue"   In the event of cardiac or respiratory ARREST Do not perform Intubation, CPR, defibrillation or ACLS   In the event of cardiac or respiratory ARREST Use medication by any route, position, wound care, and other measures to relive pain and suffering. May use oxygen, suction and manual treatment of airway obstruction as needed for comfort.      11/13/17 2121    Code Status History    Date Active Date Inactive Code Status Order ID Comments User Context   07/17/2017 12:53 07/18/2017 20:23 Full Code [id number]  Adrian SaranMody,  Sital, MD Inpatient   05/24/2016 01:43 05/26/2016 17:20 Full Code [id number]  Tonye RoyaltyHugelmeyer, Alexis, DO Inpatient   07/12/2015 11:24 07/16/2015 19:21 DNR  [id number]  Gale Journey, MD ED   07/12/2015 09:31 07/12/2015 11:24 Full Code [id number]  Milagros Loll, MD ED    Advance Directive Documentation     Most Recent Value  Type of Advance Directive  Healthcare Power of Attorney, Out of facility DNR (pink MOST or yellow form)  Pre-existing out of facility DNR order (yellow form or pink MOST form)  No data  "MOST" Form in Place?  No data      TOTAL TIME TAKING CARE OF THIS PATIENT:  43 minutes.   Note: This dictation was prepared with Dragon dictation along with smaller phrase technology. Any transcriptional errors that result from this process are unintentional.   @[facility] @  on 11/15/2017 at 3:10 PM  Between 7am to 6pm - Pager - [phone]  After 6pm go to [email or web address] - password [email or web address] [facility]  [place]  Office  [phone]  CC: Primary care physician; Lauro Regulus, MD

## 2017-11-15 NOTE — Care Management (Addendum)
Physical therapy evaluation completed. Recommending home with home health and therapy. [name] uses [facility]  for [facility] Health Will need a unit of blood prior to discharge. Gwenette GreetBrenda S Tylan Briguglio RN MSN CCM Care Management [phone]

## 2017-11-16 LAB — TYPE AND SCREEN
ABO/RH(D): O POS
ANTIBODY SCREEN: NEGATIVE
UNIT DIVISION: 0
Unit division: 0
Unit division: 0

## 2017-11-16 LAB — BPAM RBC
BLOOD PRODUCT EXPIRATION DATE: 201902032359
BLOOD PRODUCT EXPIRATION DATE: 201902202359
BLOOD PRODUCT EXPIRATION DATE: 201902202359
ISSUE DATE / TIME: 201901301306
UNIT TYPE AND RH: 5100
UNIT TYPE AND RH: 9500
Unit Type and Rh: 5100

## 2018-03-20 IMAGING — MR MR HEAD WO/W CM
10 of 13 series · 37 of 48 positions shown · IV contrast (multihance)
Comparison: CT head 05/23/2016

CLINICAL DATA: Seizure.  Dementia.

EXAM:
MRI HEAD WITHOUT AND WITH CONTRAST
TECHNIQUE: Multiplanar, multiecho pulse sequences of the brain and surrounding
structures were obtained without and with intravenous contrast.
CONTRAST:  10mL MULTIHANCE GADOBENATE DIMEGLUMINE 529 MG/ML IV SOLN

[Series 4: DWI · axial · 4.0mm · 0.94mm/px · z∈[-93,+73]mm · 4 of 43 slices shown (1 of 4)]
[im 1/43]
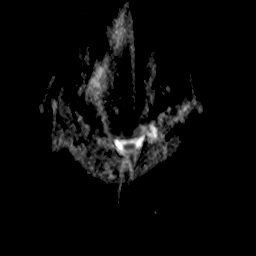
[im 15/43]
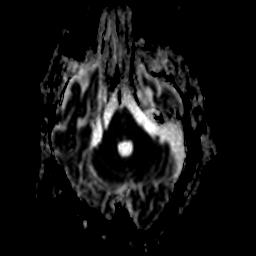
[im 29/43]
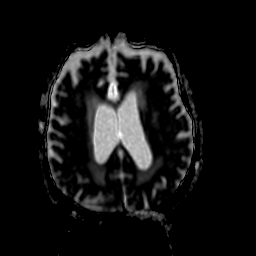
[im 43/43]
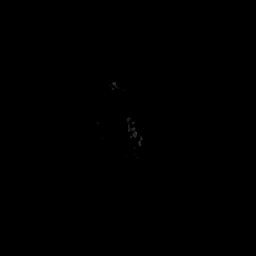

[Series 6: DWI · coronal · 5.0mm · 1.80mm/px · 4 of 40 slices shown (2 of 4)]
[im 1/40]
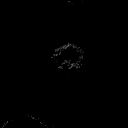
[im 14/40]
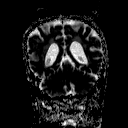
[im 27/40]
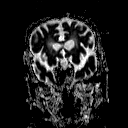
[im 40/40]
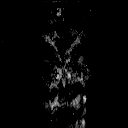

[Series 7: T2 · axial · 5.0mm · 0.45mm/px · z∈[-90,+76]mm · 3 of 27 slices shown (1 of 2)]
[im 1/27]
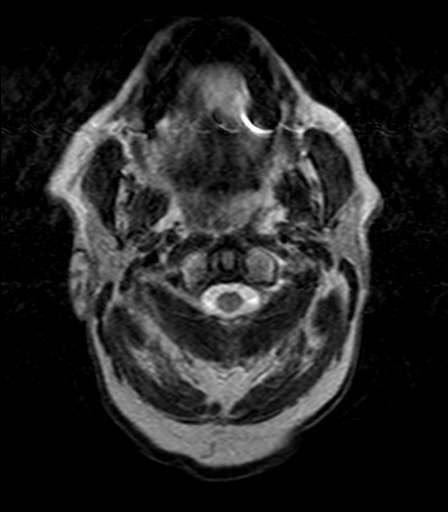
[im 14/27]
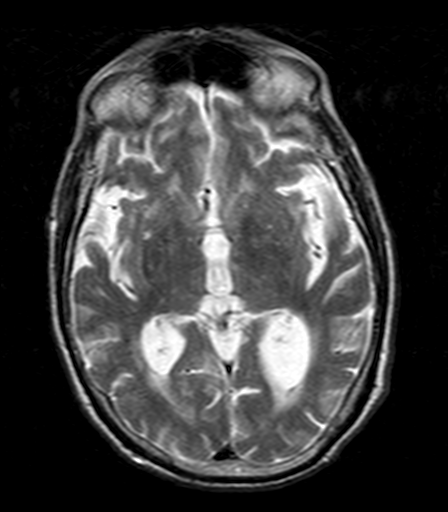
[im 27/27]
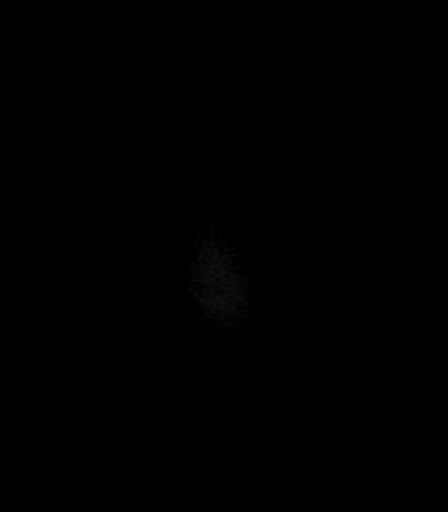

[Series 8: DWI · axial · 4.0mm · 0.94mm/px · z∈[-93,+73]mm · 4 of 40 slices shown (3 of 4)]
[im 1/40]
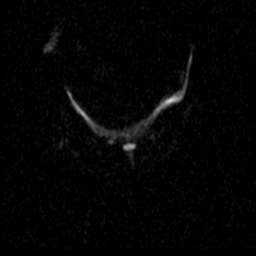
[im 14/40]
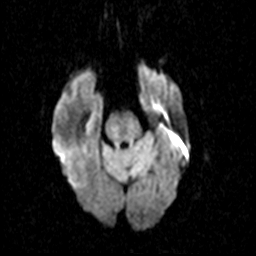
[im 27/40]
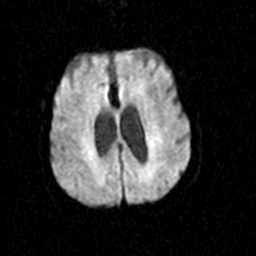
[im 40/40]
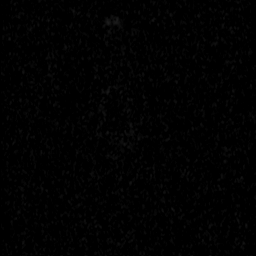

[Series 9: DWI · coronal · 5.0mm · 1.80mm/px · 4 of 40 slices shown (4 of 4)]
[im 1/40]
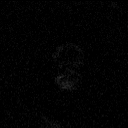
[im 14/40]
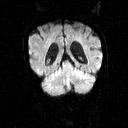
[im 27/40]
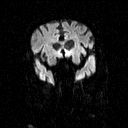
[im 40/40]
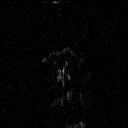

[Series 10: FLAIR · axial · 5.0mm · 0.90mm/px · z∈[-90,+76]mm · 3 of 27 slices shown]
[im 1/27]
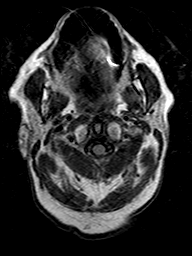
[im 14/27]
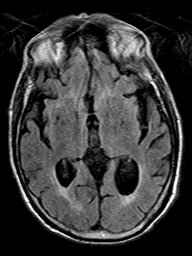
[im 27/27]
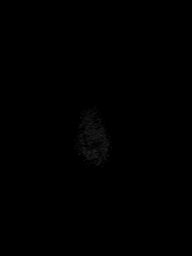

[Series 11: T2 · axial · 5.0mm · 0.45mm/px · z∈[-90,+76]mm · 3 of 27 slices shown (2 of 2)]
[im 1/27]
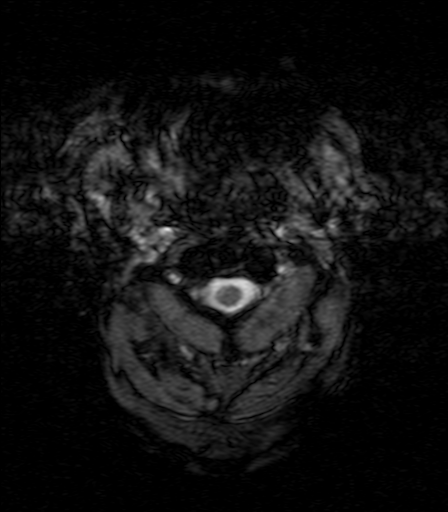
[im 14/27]
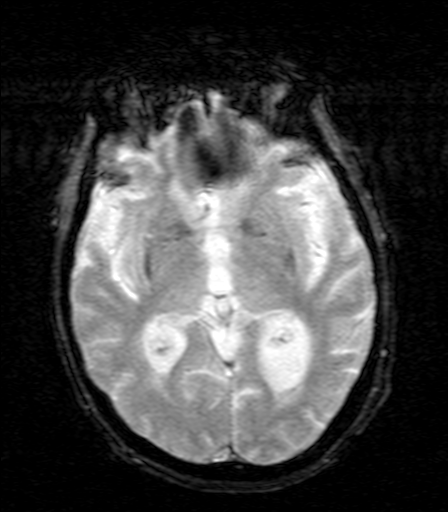
[im 27/27]
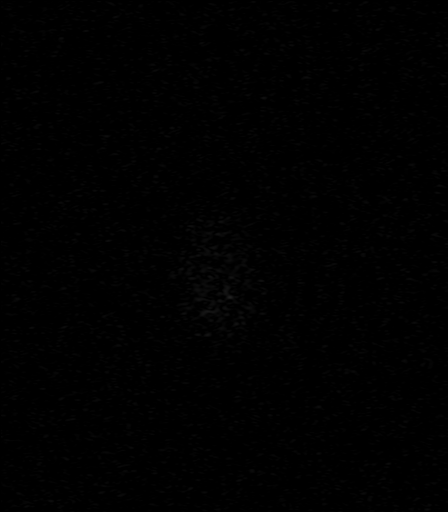

[Series 14: T1 post-contrast · axial · 3.0mm · 0.45mm/px · z∈[-94,+80]mm · 6 of 60 slices shown (1 of 3)]
[im 1/60]
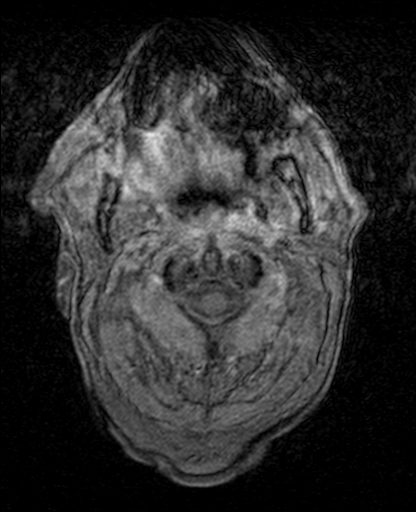
[im 12/60]
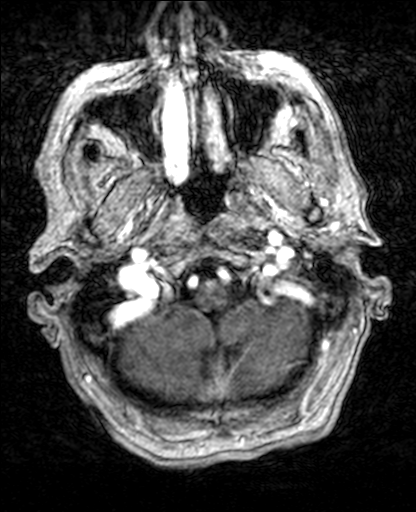
[im 24/60]
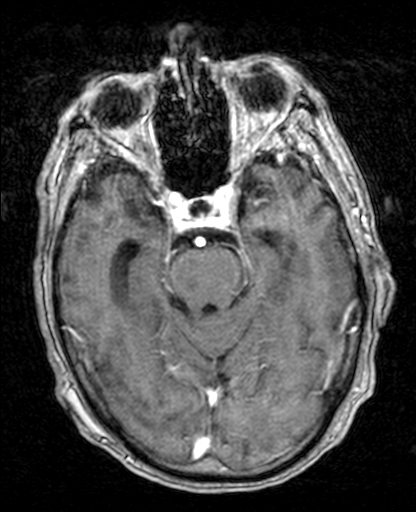
[im 36/60]
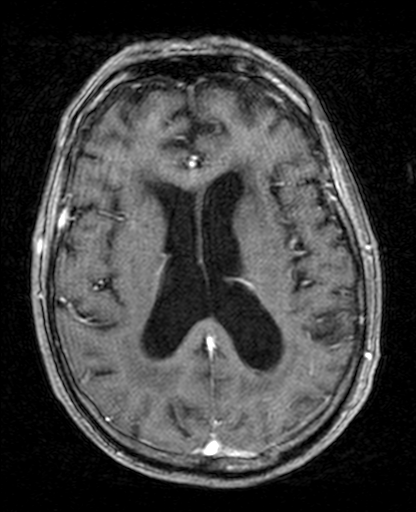
[im 48/60]
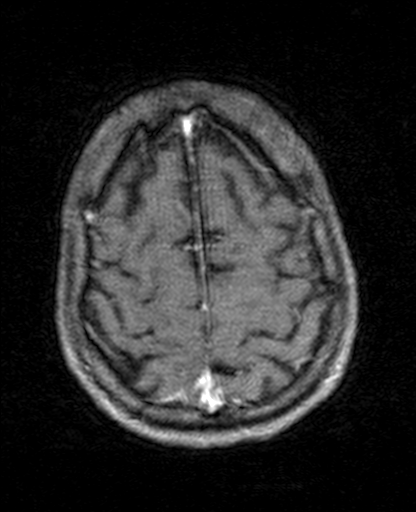
[im 60/60]
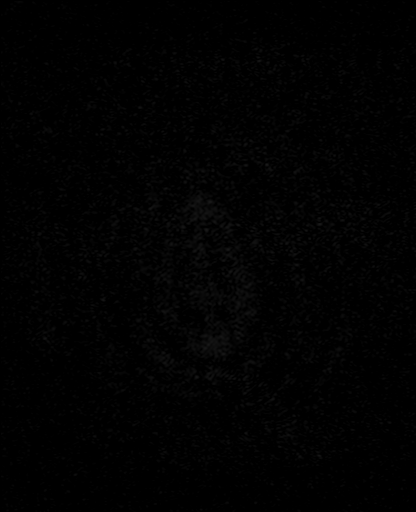

[Series 15: T1 post-contrast · coronal · 5.0mm · 0.45mm/px · 3 of 31 slices shown (2 of 3)]
[im 1/31]
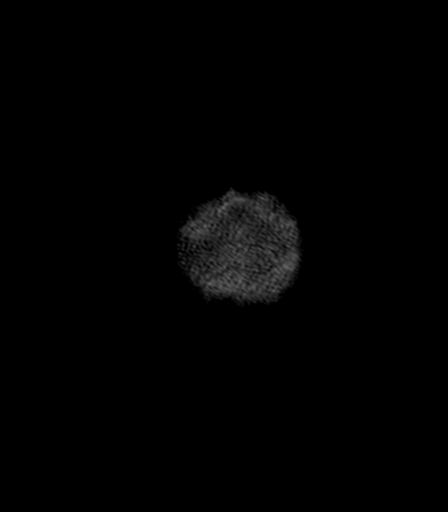
[im 16/31]
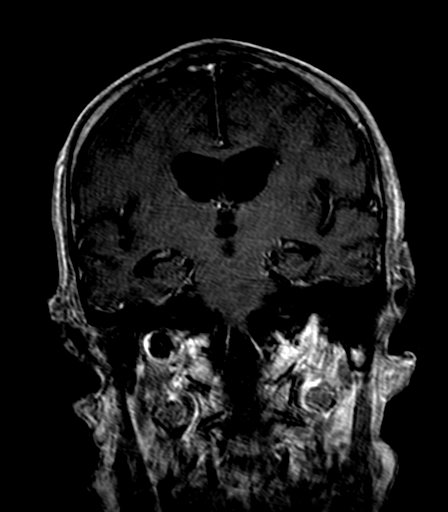
[im 31/31]
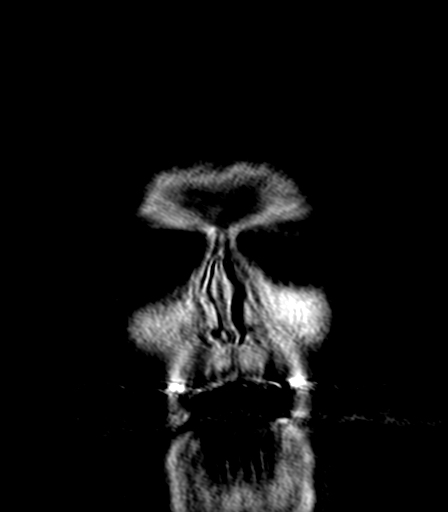

[Series 16: T1 post-contrast · sagittal · 5.0mm · 0.45mm/px · 3 of 29 slices shown (3 of 3)]
[im 1/29]
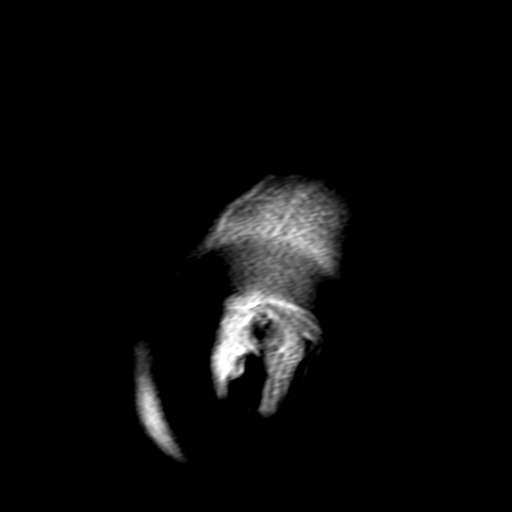
[im 15/29]
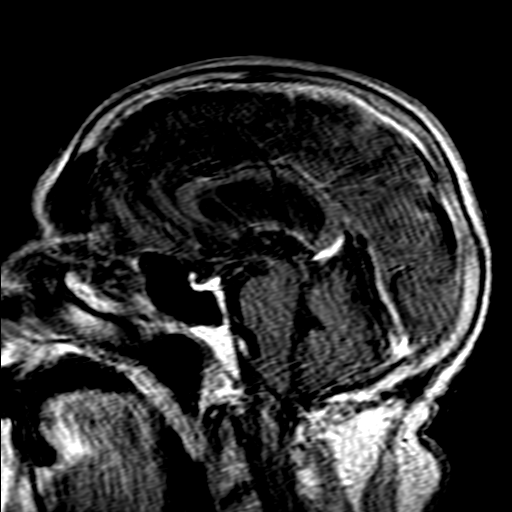
[im 29/29]
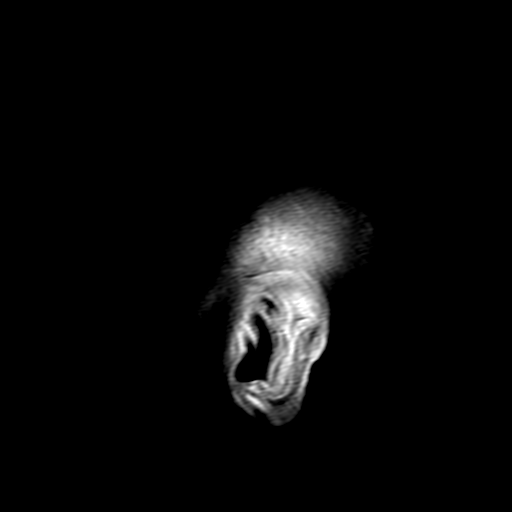

[37 of 48 positions shown; findings below may reference images not displayed]

FINDINGS: Image quality degraded by motion

Moderate atrophy.  Negative for hydrocephalus.

Negative for acute infarct. Moderate chronic microvascular ischemic
change throughout the cerebral white matter. Chronic infarct in the
right pons. Chronic infarct left basal ganglia.

Negative for intracranial hemorrhage.  No fluid collection.

Negative for mass or edema.  No shift of the midline structures.

Postcontrast images degraded by motion. Allowing for this, no
enhancing mass lesion identified. Normal venous enhancement.

Circle of Willis patent. Paranasal sinuses clear. Bilateral cataract
extraction.

Pituitary normal in size.
IMPRESSION: Moderate atrophy and moderate chronic microvascular ischemia. No
acute abnormality.

## 2018-03-21 IMAGING — DX DG CHEST 1V
1 series · 1 of 1 positions shown · non-contrast
Comparison: Radiograph May 23, 2016.

CLINICAL DATA: Wheezing.

EXAM:
CHEST 1 VIEW

[chest ap]
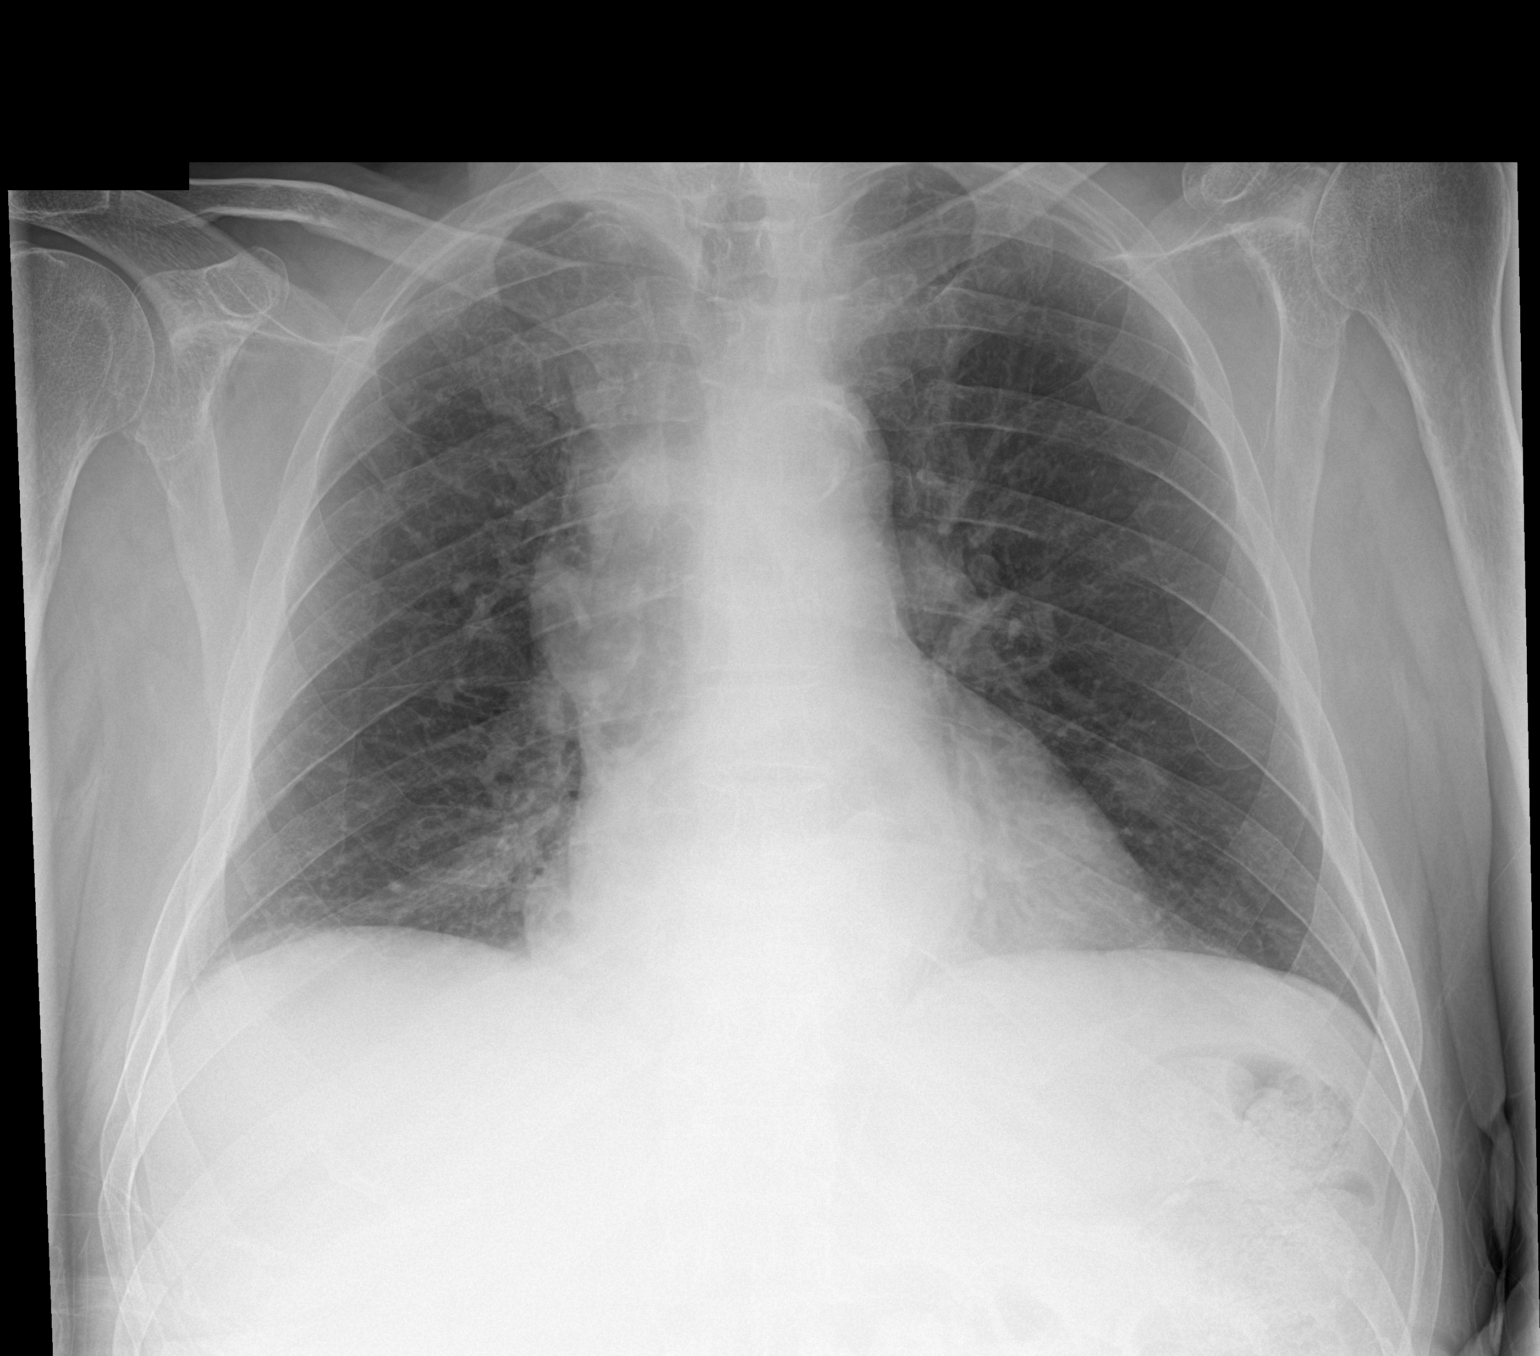

[1 of 1 positions shown; findings below may reference images not displayed]

FINDINGS: The heart size and mediastinal contours are within normal limits.
Both lungs are clear. Atherosclerosis of thoracic aorta is noted.
Moderate size sliding hiatal hernia is noted. No pneumothorax or
pleural effusion is noted. The visualized skeletal structures are
unremarkable.
IMPRESSION: Moderate size hiatal hernia. Aortic atherosclerosis. No acute
cardiopulmonary abnormality seen.

## 2018-10-02 ENCOUNTER — Encounter: Payer: Self-pay | Admitting: Nurse Practitioner

## 2018-10-02 ENCOUNTER — Non-Acute Institutional Stay: Payer: Medicare Other | Admitting: Nurse Practitioner

## 2018-10-02 VITALS — HR 82 | Temp 98.0°F | Resp 18 | Ht 68.0 in | Wt 127.2 lb

## 2018-10-02 DIAGNOSIS — Z515 Encounter for palliative care: Secondary | ICD-10-CM | POA: Insufficient documentation

## 2018-10-02 DIAGNOSIS — R413 Other amnesia: Secondary | ICD-10-CM | POA: Insufficient documentation

## 2018-10-02 DIAGNOSIS — R630 Anorexia: Secondary | ICD-10-CM

## 2018-10-02 NOTE — Progress Notes (Signed)
[facility] Telephone: [phone] Fax: [phone]  PATIENT NAME: Benjamin Kim DOB: 1932/02/09 MRN: [id number]  PRIMARY CARE PROVIDER:   Lauro Regulus, MD  REFERRING PROVIDER:  Dr Dareen Piano; Landis Gandy RESPONSIBLE PARTY:   Dagmawi Venable [phone] ASSESSMENT:     I visited and observed Benjamin Kim. We talked about purpose of palliative medicine visit. We talked about symptoms of pain which he denies. We talked about the cake that he was eating an appetite. Assessment completed and he was cooperative. He attempted to ask questions about news events. He was repetitive with the speech at times. He was oriented to self, place and time where difficult for him. He does appear to be overall declining with episodes of either seizure or TIA's. DNR in place with wishes for Comfort Care I have attempted to contact his wife for update and further discussion of goals of care and possible option of hospice screening. I updated nursing staff.   08/24/2018 weight 134.1 lb 09/23/2028 weight 127.2 lb  RECOMMENDATIONS and PLAN:   1. Palliative care encounter Z51.5; Palliative medicine team will continue to support patient, patient's family, and medical team. Visit consisted of counseling and education dealing with the complex and emotionally intense issues of symptom management and palliative care in the setting of serious and potentially life-threatening illness  2. Memory loss R41.3 secondary to dementia. Continue with supportive measures as chronic disease remains Progressive.  3. Anorexia R63.0. Support encourage to continue to go to the dining area for meals. Assistance as needed continues to encourage to feed self. Continue supplements, snacks and weights.  I spent 45 minutes providing this consultation,  from 2:00pm to 2:45pm. More than 50% of the time in this consultation was spent coordinating communication.   HISTORY OF PRESENT ILLNESS:  Benjamin Kim is a 84 y.o.  year old male with multiple medical problems including Dementia, TIA, seizure disorder, chronic kidney disease, hypertension, asthma, hypercholesterolemia, history of GI bleed, anemia, protein calorie malnutrition, anxiety, depression, prostate surgery. He continues to reside in locked memory care unit. He is ambulatory, does perform adl's with assistance. He does feed himself and appetite has been poor per staff. He has had a 6.9 pound weight loss over the last 4 weeks. Staff endorses he has had two episodes of unresponsiveness and shaking that has lasted over 45 minutes. Resolves on its own. He is currently not on any medication for seizure disorder. His wife did not want him to be transported to the hospital and only comfort care. DNR remains in place. He is able to answer questions and verbalize his needs. No recent hospitalizations, Falls, wounds, infections. Last palliative care visit 11 / 12 / 2019 for follow-up visit, he remain stable no changes.At present he is sitting at the table in the dining area eating cake. He appears comfortable. No visitors present.. Palliative Care was asked to help address goals of care.   CODE STATUS: DNR   PPS: 40% HOSPICE ELIGIBILITY/DIAGNOSIS: TBD  PAST MEDICAL HISTORY:  Past Medical History:  Diagnosis Date  . Anxiety   . Asthma   . CKD (chronic kidney disease) stage 3, GFR 30-59 ml/min (HCC)   . Dementia (HCC)   . Depressed   . Hypercholesteremia   . Hypertension   . Seizures (HCC)     SOCIAL HX:  Social History   Tobacco Use  . Smoking status: Never Smoker  . Smokeless tobacco: Never Used  Substance Use Topics  . Alcohol use: No  Alcohol/week: 0.0 standard drinks    ALLERGIES:  Allergies  Allergen Reactions  . Ivp Dye [Iodinated Diagnostic Agents] Anaphylaxis     PERTINENT MEDICATIONS:  Outpatient Encounter Medications as of 10/02/2018  Medication Sig  . acetaminophen (TYLENOL) 325 MG tablet Take 2 tablets (650 mg total) by mouth  every 6 (six) hours as needed for mild pain (or Fever >/= 101).  . busPIRone (BUSPAR) 7.5 MG tablet Take 1 tablet (7.5 mg total) by mouth 3 (three) times daily.  . feeding supplement (BOOST / RESOURCE BREEZE) LIQD Take 1 Container by mouth 2 (two) times daily with a meal.  . fenofibrate 160 MG tablet Take 160 mg by mouth every evening.  . ferrous sulfate 325 (65 FE) MG tablet Take 1 tablet (325 mg total) by mouth 2 (two) times daily with a meal.  . ipratropium-albuterol (DUONEB) 0.5-2.5 (3) MG/3ML SOLN Take 3 mLs by nebulization every 6 (six) hours as needed. (Patient taking differently: Take 3 mLs by nebulization every 6 (six) hours as needed. For shortness of breath or wheezing)  . lacosamide (VIMPAT) 50 MG TABS tablet Take 1 tablet (50 mg total) by mouth 2 (two) times daily. (Patient not taking: Reported on 11/13/2017)  . loperamide (IMODIUM) 2 MG capsule Take 2 mg by mouth as needed for diarrhea or loose stools.  . pantoprazole (PROTONIX) 40 MG tablet Take 1 tablet (40 mg total) by mouth 2 (two) times daily before a meal.  . polyethylene glycol (MIRALAX / GLYCOLAX) packet Take 17 g by mouth daily as needed for moderate constipation or severe constipation.  . QUEtiapine (SEROQUEL) 25 MG tablet Take 1 tablet (25 mg total) by mouth at bedtime.  Marland Kitchen. venlafaxine XR (EFFEXOR-XR) 150 MG 24 hr capsule Take 1 capsule (150 mg total) by mouth daily with breakfast. Take two tablets by mouth once daily (Patient taking differently: Take 300 mg by mouth daily with breakfast. )   No facility-administered encounter medications on file as of 10/02/2018.     PHYSICAL EXAM:   General: NAD, frail appearing, thin, cognitively impaired male Cardiovascular: regular rate and rhythm Pulmonary: clear ant fields Abdomen: soft, nontender, + bowel sounds GU: no suprapubic tenderness Extremities: no edema, no joint deformities Skin: no rashes Neurological: Weakness but otherwise nonfocal  Illeana Edick Prince RomeZ Sonya Pucci, NP

## 2018-10-30 ENCOUNTER — Non-Acute Institutional Stay: Payer: Medicare Other | Admitting: Nurse Practitioner

## 2018-10-30 VITALS — HR 72 | Temp 98.0°F | Resp 18 | Wt 140.4 lb

## 2018-10-30 DIAGNOSIS — R630 Anorexia: Secondary | ICD-10-CM

## 2018-10-30 DIAGNOSIS — Z515 Encounter for palliative care: Secondary | ICD-10-CM

## 2018-10-30 DIAGNOSIS — R413 Other amnesia: Secondary | ICD-10-CM | POA: Insufficient documentation

## 2018-10-30 NOTE — Progress Notes (Signed)
[facility] Telephone: [phone] Fax: [phone]  PATIENT NAME: Benjamin Kim DOB: 1932/05/10 MRN: [id number]  PRIMARY CARE PROVIDER:   Kirk Ruths, MD  REFERRING PROVIDER:  [facility] RESPONSIBLE PARTY:   Yovan Leeman wife [phone] or [phone]  ASSESSMENT:     I visited and observed Benjamin Kim. He did open his eyes and briefly make eye contact to verbal cues but then return to sleep. He was cooperative with assessment. He does appear to be stable in the setting of chronic disease of dementia. I met with Ms Mullaly. We talked about purpose of palliative care and verbal consent obtained. Clinical update discuss. We talked about symptoms, appetite. We talked about his functional-level walking with his walker. We talked about her upcoming surgery and her concerns. We talked about medical goals of care to continue to focus on comfort with DNR remaining in place. We talked about role of palliative are in plan of care. Discussed will continue to follow monitor with next visit in 12 weeks if needed or sooner should he declined as he currently appears stable. Ms. Dorwart in agreement. Updated staff. Therapeutic listening and emotional support provided. Questions answered satisfaction. Contact information provided.   RECOMMENDATIONS and PLAN:  1. Palliative care encounter Z51.5; Palliative medicine team will continue to support patient, patient's family, and medical team. Visit consisted of counseling and education dealing with the complex and emotionally intense issues of symptom management and palliative care in the setting of serious and potentially life-threatening illness  2. Memory loss R41.3 secondary to dementia. Continue with supportive measures as chronic disease remains Progressive.  3. Anorexia R63.0. Support encourage to continue to go to the dining area for meals. Assistance as needed continues to encourage to feed  self. Continue supplements, snacks and weights.  I spent 75 minutes providing this consultation,  from 2:00pm to 3:15pm. More than 50% of the time in this consultation was spent coordinating communication.   HISTORY OF PRESENT ILLNESS:  Benjamin Kim is a 85 y.o. year old male with multiple medical problems includingDementia, chronic kidney disease, asthma, ta, seizure disorder, hypertension, hypercholesterolemia, history of gastrointestinal bleed, protein calorie malnutrition, anemia, anxiety, depression, prostate surgery. Benjamin. Kim continues to reside in skilled locked memory care unit. He does ambulate with his rolling walker. No recent Falls, wounds, hospitalizations, infections. Wishes are for medical goals to focus on comfort with DNR remaining in place. He does continue to require assistance and prompting with ADLs. He does attempt to assist with toileting. Benjamin Kim does feed himself and appetite has been fair to poor depending on what's being served. Ms Knutzen does come every day at lunch although she has upcoming cataract surgery where she won't be as available. Staff endorses no other changes, episodes of unresponsiveness. At present he is sitting in the chair in the sun room sleeping. He appears comfortable. Ms Shenk his wife is with him. Palliative Care was asked to help address goals of care.   CODE STATUS: DNR  PPS: 40% HOSPICE ELIGIBILITY/DIAGNOSIS: TBD  PAST MEDICAL HISTORY:  Past Medical History:  Diagnosis Date  . Anxiety   . Asthma   . CKD (chronic kidney disease) stage 3, GFR 30-59 ml/min (HCC)   . Dementia ([place])   . Depressed   . Hypercholesteremia   . Hypertension   . Seizures ([place])     SOCIAL HX:  Social History   Tobacco Use  . Smoking status: Never Smoker  .  Smokeless tobacco: Never Used  Substance Use Topics  . Alcohol use: No    Alcohol/week: 0.0 standard drinks    ALLERGIES:  Allergies  Allergen Reactions  . Ivp Dye [Iodinated Diagnostic Agents]  Anaphylaxis     PERTINENT MEDICATIONS:  Outpatient Encounter Medications as of 10/30/2018  Medication Sig  . acetaminophen (TYLENOL) 325 MG tablet Take 2 tablets (650 mg total) by mouth every 6 (six) hours as needed for mild pain (or Fever >/= 101).  . busPIRone (BUSPAR) 7.5 MG tablet Take 1 tablet (7.5 mg total) by mouth 3 (three) times daily.  . feeding supplement (BOOST / RESOURCE BREEZE) LIQD Take 1 Container by mouth 2 (two) times daily with a meal.  . fenofibrate 160 MG tablet Take 160 mg by mouth every evening.  . ferrous sulfate 325 (65 FE) MG tablet Take 1 tablet (325 mg total) by mouth 2 (two) times daily with a meal.  . ipratropium-albuterol (DUONEB) 0.5-2.5 (3) MG/3ML SOLN Take 3 mLs by nebulization every 6 (six) hours as needed. (Patient taking differently: Take 3 mLs by nebulization every 6 (six) hours as needed. For shortness of breath or wheezing)  . lacosamide (VIMPAT) 50 MG TABS tablet Take 1 tablet (50 mg total) by mouth 2 (two) times daily. (Patient not taking: Reported on 11/13/2017)  . loperamide (IMODIUM) 2 MG capsule Take 2 mg by mouth as needed for diarrhea or loose stools.  . pantoprazole (PROTONIX) 40 MG tablet Take 1 tablet (40 mg total) by mouth 2 (two) times daily before a meal.  . polyethylene glycol (MIRALAX / GLYCOLAX) packet Take 17 g by mouth daily as needed for moderate constipation or severe constipation.  . QUEtiapine (SEROQUEL) 25 MG tablet Take 1 tablet (25 mg total) by mouth at bedtime.  Marland Kitchen venlafaxine XR (EFFEXOR-XR) 150 MG 24 hr capsule Take 1 capsule (150 mg total) by mouth daily with breakfast. Take two tablets by mouth once daily (Patient taking differently: Take 300 mg by mouth daily with breakfast. )   No facility-administered encounter medications on file as of 10/30/2018.     PHYSICAL EXAM:   General: NAD, frail appearing, thin, elderly male Cardiovascular: regular rate and rhythm Pulmonary: clear ant fields Abdomen: soft, nontender, + bowel  sounds GU: no suprapubic tenderness Extremities: no edema, no joint deformities Skin: no rashes Neurological: Weakness but otherwise nonfocal  Christin Ihor Gully, NP

## 2019-01-10 ENCOUNTER — Encounter: Payer: Self-pay | Admitting: Nurse Practitioner

## 2019-01-10 ENCOUNTER — Other Ambulatory Visit: Payer: Self-pay

## 2019-01-10 ENCOUNTER — Non-Acute Institutional Stay: Payer: Medicare Other | Admitting: Nurse Practitioner

## 2019-01-10 VITALS — HR 82 | Resp 18 | Wt 141.1 lb

## 2019-01-10 DIAGNOSIS — R630 Anorexia: Secondary | ICD-10-CM | POA: Insufficient documentation

## 2019-01-10 DIAGNOSIS — Z515 Encounter for palliative care: Secondary | ICD-10-CM

## 2019-01-10 DIAGNOSIS — R413 Other amnesia: Secondary | ICD-10-CM | POA: Insufficient documentation

## 2019-01-10 NOTE — Progress Notes (Signed)
Therapist, nutritional Palliative Care Consult Note Telephone: [phone]  Fax: [phone]  PATIENT NAME: Benjamin Kim DOB: 1932-09-05 MRN: [id number]  PRIMARY CARE PROVIDER:   Lauro Regulus, MD  REFERRING PROVIDER:  Lauro Regulus, MD [address] [facility] [place], [place] [address]  RESPONSIBLE PARTY:   Benjamin Kim wife [phone] or [phone]  RECOMMENDATIONS and PLAN:  1.Palliative care encounter Z51.5; Palliative medicine team will continue to support patient, patient's family, and medical team. Visit consisted of counseling and education dealing with the complex and emotionally intense issues of symptom management and palliative care in the setting of serious and potentially life-threatening illness  2.Memory loss R41.3 secondary to dementia. Continue with supportive measures as chronic disease remains Progressive.  3.Anorexia R63.0. Support encourage to continue to go to the dining area for meals. Assistance as needed continues to encourage to feed self. Continue supplements, snacks and weights.  ASSESSMENT:      I visit and observed Benjamin Kim. Talked about purpose of palliative care visit and he smiled and said thank you. He asked if I was part of his family. Explain palliative care though limited ability to understand secondary to cognitive impairment. He was cooperative with the assessment. He denied any symptoms of pain or shortness of breath. We talked about his appetite. We talked about foods that he likes. We talked about the hot dog he was eating. Benjamin Kim endorses his favorite food. He talked about how he likes certain things on his hot dog. He talked about his wife and how much he misses her. At present time unit is on lockdown as secondary prevention to covid  19. Benjamin Kim continue to reiterate how much he missed his wife. Emotional support provided. He does continue to appear stable at present  time. I have dated nursing staffing any changes to current goals or plan of care.  I called Mrs. Benjamin Kim. Talked about purpose for palliative care visit. Talked about visit with mr. Benjamin Kim. We talked about his birthday being today. He did have a party. Mrs Kim was able to bring party supplies though not allowed in the building as they have a new visitors policy at present time do to covid-19 pandemic crisis. We talked about clinical presentation and that it present time Mr Kim does appear stable. He was smiling, happy and Interactive. DNR does remain in place. We talked about progression of chronic disease. We talked about role of palliative care and plan of care. Next palliative visit in two months if needed or sooner should he declined. Contact information provided. Questions answered the satisfaction. Therapeutic listening and emotional support provided.  12/16/2018 weight 141.1 lbs  I spent 60 minutes providing this consultation,  From 12:30pm to 1:30pm. More than 50% of the time in this consultation was spent coordinating communication.   HISTORY OF PRESENT ILLNESS:  Benjamin Kim is a 85 y.o. year old male with multiple medical problems including dementia, chronic kidney disease, asthma, ta, seizure disorder, hypertension, hypercholesterolemia, history of gastrointestinal bleed, protein calorie malnutrition, anemia, anxiety, depression, prostate surgery. Benjamin Kim continues to reside at locked memory care unit at [facility]. He is ambulatory slow shuffle with a walker. He does require assistance with ADLs and incontinent of urine. He is able to feed himself and appetite has been fair. No recent wounds, hospitalizations, Falls, infections. He is a DNR. He does verbalize his needs. Staff endorses no recent difficulties with behaviors. At present  he is sitting in the dining area with other residents eating a hot dog. He appears comfortable. No visitors present. Palliative Care was asked to help  address goals of care.   CODE STATUS: DNR  PPS: 40% HOSPICE ELIGIBILITY/DIAGNOSIS: TBD  PAST MEDICAL HISTORY:  Past Medical History:  Diagnosis Date  . Anxiety   . Asthma   . CKD (chronic kidney disease) stage 3, GFR 30-59 ml/min (HCC)   . Dementia (HCC)   . Depressed   . Hypercholesteremia   . Hypertension   . Seizures (HCC)     SOCIAL HX:  Social History   Tobacco Use  . Smoking status: Never Smoker  . Smokeless tobacco: Never Used  Substance Use Topics  . Alcohol use: No    Alcohol/week: 0.0 standard drinks    ALLERGIES:  Allergies  Allergen Reactions  . Ivp Dye [Iodinated Diagnostic Agents] Anaphylaxis     PERTINENT MEDICATIONS:  Outpatient Encounter Medications as of 01/10/2019  Medication Sig  . acetaminophen (TYLENOL) 325 MG tablet Take 2 tablets (650 mg total) by mouth every 6 (six) hours as needed for mild pain (or Fever >/= 101).  . busPIRone (BUSPAR) 7.5 MG tablet Take 1 tablet (7.5 mg total) by mouth 3 (three) times daily.  . feeding supplement (BOOST / RESOURCE BREEZE) LIQD Take 1 Container by mouth 2 (two) times daily with a meal.  . fenofibrate 160 MG tablet Take 160 mg by mouth every evening.  . ferrous sulfate 325 (65 FE) MG tablet Take 1 tablet (325 mg total) by mouth 2 (two) times daily with a meal.  . ipratropium-albuterol (DUONEB) 0.5-2.5 (3) MG/3ML SOLN Take 3 mLs by nebulization every 6 (six) hours as needed. (Patient taking differently: Take 3 mLs by nebulization every 6 (six) hours as needed. For shortness of breath or wheezing)  . lacosamide (VIMPAT) 50 MG TABS tablet Take 1 tablet (50 mg total) by mouth 2 (two) times daily. (Patient not taking: Reported on 11/13/2017)  . loperamide (IMODIUM) 2 MG capsule Take 2 mg by mouth as needed for diarrhea or loose stools.  . pantoprazole (PROTONIX) 40 MG tablet Take 1 tablet (40 mg total) by mouth 2 (two) times daily before a meal.  . polyethylene glycol (MIRALAX / GLYCOLAX) packet Take 17 g by mouth  daily as needed for moderate constipation or severe constipation.  . QUEtiapine (SEROQUEL) 25 MG tablet Take 1 tablet (25 mg total) by mouth at bedtime.  Marland Kitchen venlafaxine XR (EFFEXOR-XR) 150 MG 24 hr capsule Take 1 capsule (150 mg total) by mouth daily with breakfast. Take two tablets by mouth once daily (Patient taking differently: Take 300 mg by mouth daily with breakfast. )   No facility-administered encounter medications on file as of 01/10/2019.     PHYSICAL EXAM:   General: NAD, frail appearing, thin male Cardiovascular: regular rate and rhythm Pulmonary: clear ant fields Abdomen: soft, nontender, + bowel sounds GU: no suprapubic tenderness Extremities: no edema, no joint deformities Skin: no rashes Neurological: Weakness but otherwise nonfocal  Christin Prince [place], NP

## 2019-05-23 ENCOUNTER — Non-Acute Institutional Stay: Payer: Medicare Other | Admitting: Nurse Practitioner

## 2019-05-23 ENCOUNTER — Other Ambulatory Visit: Payer: Self-pay

## 2019-05-23 ENCOUNTER — Encounter: Payer: Self-pay | Admitting: Nurse Practitioner

## 2019-05-23 DIAGNOSIS — Z515 Encounter for palliative care: Secondary | ICD-10-CM

## 2019-05-23 NOTE — Progress Notes (Signed)
[facility] Consult Note Telephone: [phone]  Fax: [phone]  PATIENT NAME: Benjamin Kim DOB: 20-May-1932 MRN: [id number]  PRIMARY CARE PROVIDER:   Kirk Ruths, MD  REFERRING PROVIDER:  Lawson Radar NP/[place] [place] [facility] RESPONSIBLE PARTY:   Izaiha Lo [phone]  Due to the COVID-19 crisis, this visit was done via telemedicine from my office and it was initiated and consent by this patient and or family.  RECOMMENDATIONS and PLAN:   1. ACP; DNR in place; will complete new DNR form for the purpose of putting in Epic/Vynce with goals to focus on comfort.  2. Memory loss; secondary to dementia. Continue with supportive measures as chronic disease remains Progressive.  3. Anorexia;. improved with slight weight gain. Support encourage to continue to go to the dining area for meals. Assistance as needed continues to encourage to feed self. Continue supplements, snacks and weights.  4. Palliative care encounter; Palliative medicine team will continue to support patient, patient's family, and medical team. Visit consisted of counseling and education dealing with the complex and emotionally intense issues of symptom management and palliative care in the setting of serious and potentially life-threatening illness  I spent 45 minutes providing this consultation,  from 9:15am to 10:00am. More than 50% of the time in this consultation was spent coordinating communication.   HISTORY OF PRESENT ILLNESS:  Benjamin Kim is a 85 y.o. year old male with multiple medical problems including Dementia, TIA, seizure disorder, chronic kidney disease, hypertension, asthma, hypercholesterolemia, history of GI bleed, anemia, protein calorie malnutrition, anxiety, depression, prostate surgery. I was able to have a telemedicine visit with Benjamin Kim and Benjamin Bandy, RN to facilitate Zoom meeting. Benjamin Kim continues to remain oriented to self  but otherwise confuse. Staff endorses he has been having more episodes of agitation. He has had a recent fall with hematoma, ecchymosis to right forehead. His left hand has continued to be contracted as he has a ball he is currently squeezing and exercising his hand. He does appear to have lost weight and staff getting a current weight today. He was able to answer simple questions though no meaningful discussion with cognitive impairment. He does appear to be able to stand and pivot. He does have a rolling walker. Staff endorses no visitors do to covid-19 pandemic which has made it difficult as his wife has not been able to visit. I have attempted to contact Ms Wlodarczyk for update on palliative care visit. Will continue to follow monitor at present time monitoring weights with next visit in 2 months of needed or sooner should he declined. We'll update staff and Benjamin Kim nurse practitioner. Palliative Care was asked to help to continue to address goals of care.   08/24/2018 weight 134.1 lb 09/23/2028 weight 127.2 lb 04/17/2019 weight 130.1 lb CODE STATUS: DNR  PPS: 40% HOSPICE ELIGIBILITY/DIAGNOSIS: TBD  PAST MEDICAL HISTORY:  Past Medical History:  Diagnosis Date   Anxiety    Asthma    CKD (chronic kidney disease) stage 3, GFR 30-59 ml/min (HCC)    Dementia (HCC)    Depressed    Hypercholesteremia    Hypertension    Seizures ([place])     SOCIAL HX:  Social History   Tobacco Use   Smoking status: Never Smoker   Smokeless tobacco: Never Used  Substance Use Topics   Alcohol use: No    Alcohol/week: 0.0 standard drinks    ALLERGIES:  Allergies  Allergen Reactions  Ivp Dye [Iodinated Diagnostic Agents] Anaphylaxis     PERTINENT MEDICATIONS:  Outpatient Encounter Medications as of 05/23/2019  Medication Sig   acetaminophen (TYLENOL) 325 MG tablet Take 2 tablets (650 mg total) by mouth every 6 (six) hours as needed for mild pain (or Fever >/= 101).   busPIRone (BUSPAR)  7.5 MG tablet Take 1 tablet (7.5 mg total) by mouth 3 (three) times daily.   feeding supplement (BOOST / RESOURCE BREEZE) LIQD Take 1 Container by mouth 2 (two) times daily with a meal.   fenofibrate 160 MG tablet Take 160 mg by mouth every evening.   ferrous sulfate 325 (65 FE) MG tablet Take 1 tablet (325 mg total) by mouth 2 (two) times daily with a meal.   ipratropium-albuterol (DUONEB) 0.5-2.5 (3) MG/3ML SOLN Take 3 mLs by nebulization every 6 (six) hours as needed. (Patient taking differently: Take 3 mLs by nebulization every 6 (six) hours as needed. For shortness of breath or wheezing)   lacosamide (VIMPAT) 50 MG TABS tablet Take 1 tablet (50 mg total) by mouth 2 (two) times daily. (Patient not taking: Reported on 11/13/2017)   loperamide (IMODIUM) 2 MG capsule Take 2 mg by mouth as needed for diarrhea or loose stools.   pantoprazole (PROTONIX) 40 MG tablet Take 1 tablet (40 mg total) by mouth 2 (two) times daily before a meal.   polyethylene glycol (MIRALAX / GLYCOLAX) packet Take 17 g by mouth daily as needed for moderate constipation or severe constipation.   QUEtiapine (SEROQUEL) 25 MG tablet Take 1 tablet (25 mg total) by mouth at bedtime.   venlafaxine XR (EFFEXOR-XR) 150 MG 24 hr capsule Take 1 capsule (150 mg total) by mouth daily with breakfast. Take two tablets by mouth once daily (Patient taking differently: Take 300 mg by mouth daily with breakfast. )   No facility-administered encounter medications on file as of 05/23/2019.     PHYSICAL EXAM:   Deferred  Devany Aja Z Rosalita Carey, NP

## 2022-04-16 DEATH — deceased
